# Patient Record
Sex: Female | Born: 1975
Health system: Southern US, Community
[De-identification: ages and names within clinical notes are randomized; demographics above are authoritative.]

## PROBLEM LIST (undated history)

## (undated) DIAGNOSIS — F329 Major depressive disorder, single episode, unspecified: Secondary | ICD-10-CM

## (undated) DIAGNOSIS — N200 Calculus of kidney: Secondary | ICD-10-CM

## (undated) DIAGNOSIS — F419 Anxiety disorder, unspecified: Secondary | ICD-10-CM

## (undated) DIAGNOSIS — E785 Hyperlipidemia, unspecified: Secondary | ICD-10-CM

## (undated) DIAGNOSIS — I10 Essential (primary) hypertension: Secondary | ICD-10-CM

## (undated) DIAGNOSIS — F32A Depression, unspecified: Secondary | ICD-10-CM

## (undated) HISTORY — DX: Major depressive disorder, single episode, unspecified: F32.9

## (undated) HISTORY — DX: Depression, unspecified: F32.A

## (undated) HISTORY — DX: Essential (primary) hypertension: I10

## (undated) HISTORY — DX: Hyperlipidemia, unspecified: E78.5

## (undated) HISTORY — DX: Anxiety disorder, unspecified: F41.9

## (undated) HISTORY — PX: WISDOM TOOTH EXTRACTION: SHX21

## (undated) HISTORY — DX: Calculus of kidney: N20.0

---

## 2002-07-31 ENCOUNTER — Encounter: Payer: Self-pay | Admitting: Gastroenterology

## 2002-07-31 ENCOUNTER — Ambulatory Visit (HOSPITAL_COMMUNITY): Admission: RE | Admit: 2002-07-31 | Discharge: 2002-07-31 | Payer: Self-pay | Admitting: Gastroenterology

## 2002-08-01 ENCOUNTER — Ambulatory Visit (HOSPITAL_COMMUNITY): Admission: RE | Admit: 2002-08-01 | Discharge: 2002-08-01 | Payer: Self-pay | Admitting: Gastroenterology

## 2002-08-01 ENCOUNTER — Encounter: Payer: Self-pay | Admitting: Gastroenterology

## 2005-01-02 HISTORY — PX: LITHOTRIPSY: SUR834

## 2005-05-30 ENCOUNTER — Ambulatory Visit: Payer: Self-pay | Admitting: Internal Medicine

## 2005-06-05 ENCOUNTER — Ambulatory Visit (HOSPITAL_COMMUNITY): Admission: RE | Admit: 2005-06-05 | Discharge: 2005-06-05 | Payer: Self-pay | Admitting: Urology

## 2005-06-13 ENCOUNTER — Ambulatory Visit (HOSPITAL_COMMUNITY): Admission: RE | Admit: 2005-06-13 | Discharge: 2005-06-13 | Payer: Self-pay | Admitting: Urology

## 2005-08-21 ENCOUNTER — Emergency Department: Payer: Self-pay | Admitting: Internal Medicine

## 2006-12-18 ENCOUNTER — Ambulatory Visit: Payer: Self-pay

## 2007-01-17 IMAGING — CR DG ABDOMEN 1V
1 series · 1 of 1 positions shown · non-contrast
Comparison: Previous study of 06/05/05.

CLINICAL DATA: Postop stone, left renal collecting system. 
 ABDOMEN ? 1 VIEW:

[t abdomen supine]
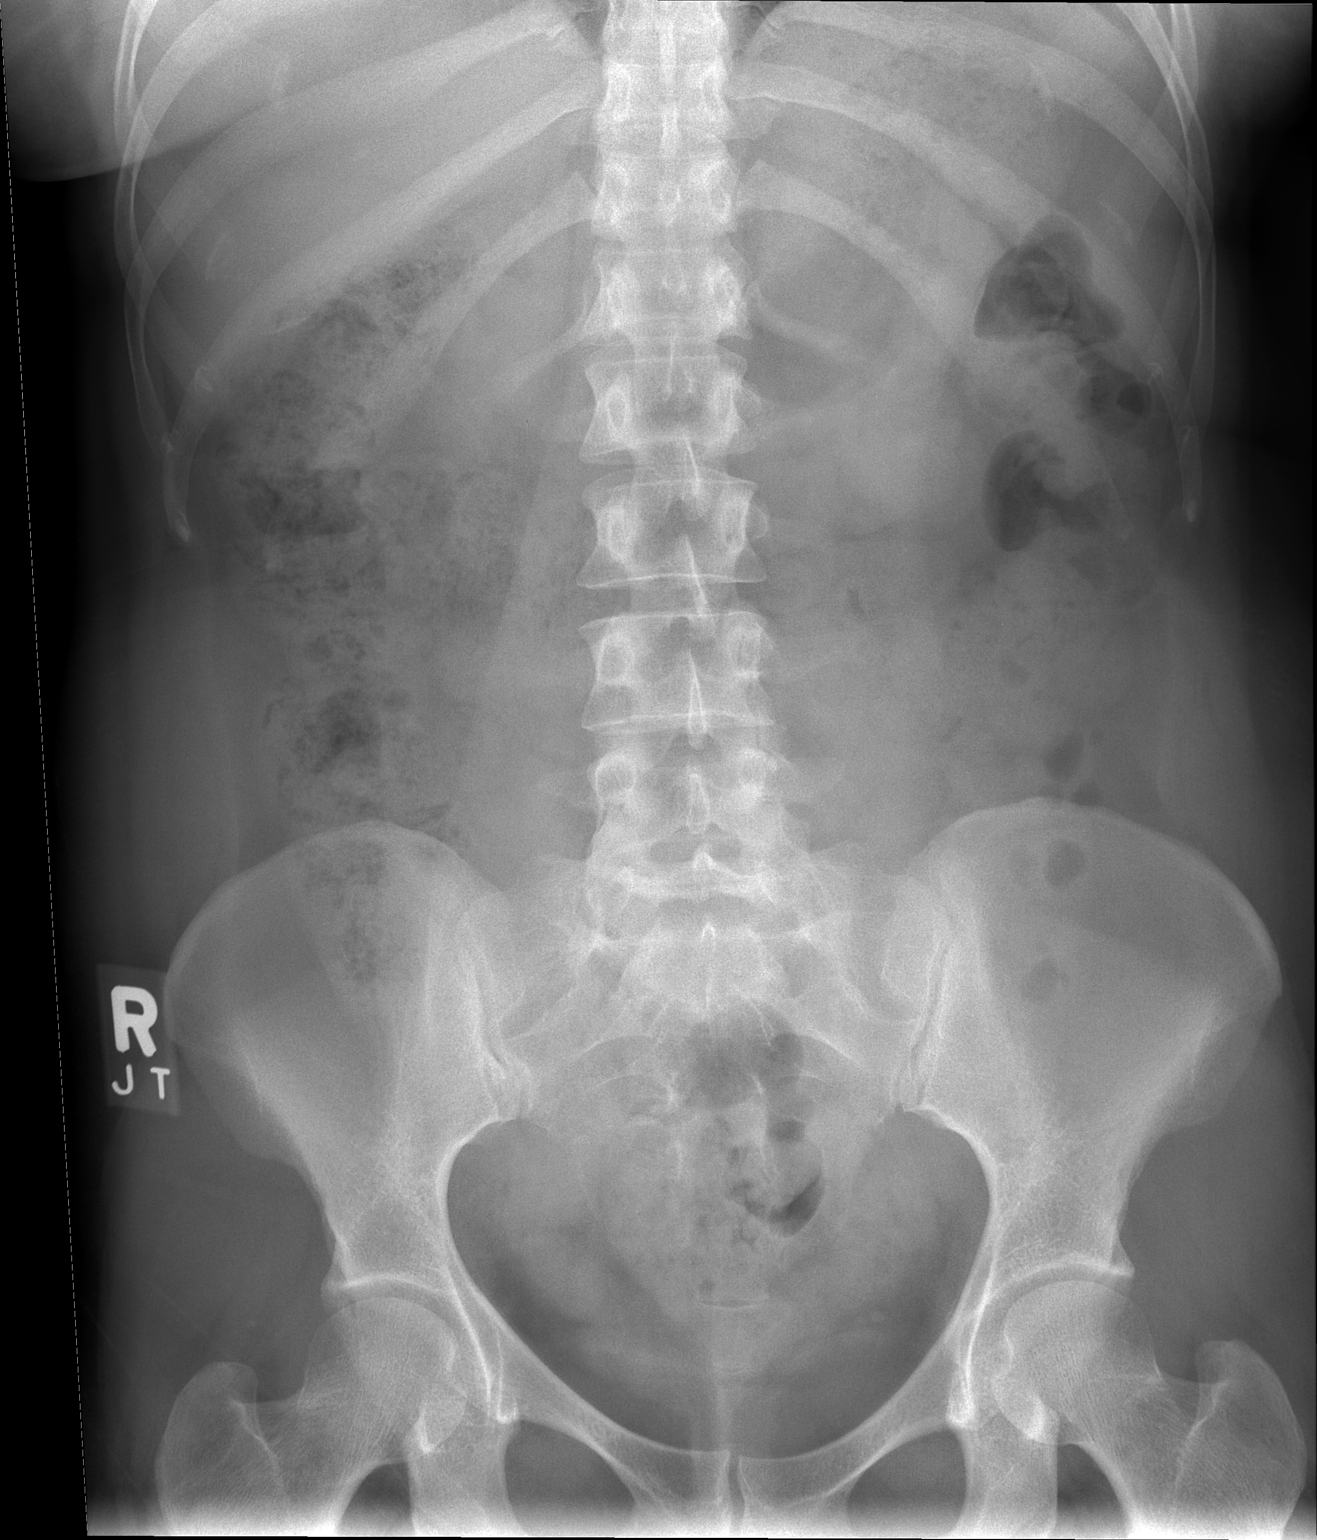

[1 of 1 positions shown; findings below may reference images not displayed]

FINDINGS: Single KUB is made postop.  The left 4 x 14 mm stone that was seen in the region of the left ureteropelvic junction is no longer.  There are seen two small densities in the region of the left distal ureter just above the ureterovesical junction, one of which measures 2 mm, and the other measures 4 mm.
IMPRESSION: The large 4 x 14 mm calcification in the region of the left ureteropelvic junction now appears to have resolved, with two smaller calcifications, which appear to be in the region of the left distal ureter and measure 2 and 4 mm in maximum diameters.

## 2009-11-02 ENCOUNTER — Emergency Department: Payer: Self-pay | Admitting: Emergency Medicine

## 2013-03-27 ENCOUNTER — Telehealth: Payer: Self-pay | Admitting: Internal Medicine

## 2013-03-27 NOTE — Telephone Encounter (Signed)
The patient is calling to establish with you . She stated she was a patient of yours at [facility]. Please advise

## 2013-03-27 NOTE — Telephone Encounter (Signed)
Ok

## 2013-04-14 NOTE — Telephone Encounter (Signed)
Appointment made by donna 6/11

## 2013-06-12 ENCOUNTER — Ambulatory Visit: Payer: Self-pay | Admitting: Internal Medicine

## 2013-08-26 ENCOUNTER — Ambulatory Visit: Payer: Self-pay | Admitting: Internal Medicine

## 2014-01-06 ENCOUNTER — Ambulatory Visit: Payer: Self-pay | Admitting: Internal Medicine

## 2014-03-17 ENCOUNTER — Ambulatory Visit: Payer: Self-pay | Admitting: Internal Medicine

## 2014-07-02 ENCOUNTER — Ambulatory Visit: Payer: Self-pay | Admitting: Internal Medicine

## 2014-09-17 ENCOUNTER — Ambulatory Visit: Payer: Self-pay | Admitting: Internal Medicine

## 2014-09-28 ENCOUNTER — Encounter: Payer: Self-pay | Admitting: *Deleted

## 2014-09-28 ENCOUNTER — Ambulatory Visit
Admission: RE | Admit: 2014-09-28 | Discharge: 2014-09-28 | Disposition: A | Discharge status: Home / Self Care | Payer: Self-pay | Source: Ambulatory Visit | Attending: Oncology | Admitting: Oncology

## 2014-09-28 ENCOUNTER — Ambulatory Visit: Payer: Self-pay | Attending: Oncology | Admitting: *Deleted

## 2014-09-28 VITALS — BP 138/88 | HR 63 | Temp 96.9°F | Ht 59.0 in | Wt 148.0 lb

## 2014-09-28 DIAGNOSIS — Z0000 Encounter for general adult medical examination without abnormal findings: Secondary | ICD-10-CM

## 2014-09-28 NOTE — Progress Notes (Signed)
Letter mailed from the [facility] to inform patient of her normal mammogram results.  Patient is to follow-up with annual screening in one year.  HSIS to Christy. 

## 2014-09-28 NOTE — Progress Notes (Signed)
Subjective:     Patient ID: Angelica May, female   DOB: May 30, 1975, 39 y.o.   MRN: [id number]  HPI   Review of Systems     Objective:   Physical Exam  Pulmonary/Chest: Right breast exhibits no inverted nipple, no mass, no nipple discharge, no skin change and no tenderness. Left breast exhibits no inverted nipple, no mass, no nipple discharge, no skin change and no tenderness. Breasts are symmetrical.       Assessment:     39 year old White female presents to [facility] for clinical breast exam and mammogram.  Clinical breast exam unremarkable.  Taught self breast awareness.  Family history of breast cancer includes a maternal grandmother and maternal first cousin with breast cancer in her 37's.  Patient has been screened for eligibility.  She does not have any insurance, Medicare or Medicaid.  She also meets financial eligibility.  Hand-out given on the Affordable Care Act.     Plan:     Screening mammogram ordered.  Will follow up per protocol.

## 2014-09-30 ENCOUNTER — Ambulatory Visit: Payer: Self-pay

## 2015-01-12 ENCOUNTER — Ambulatory Visit: Payer: Self-pay | Admitting: Internal Medicine

## 2015-03-11 ENCOUNTER — Ambulatory Visit: Payer: Self-pay | Admitting: Internal Medicine

## 2015-08-10 ENCOUNTER — Ambulatory Visit: Payer: Self-pay | Admitting: Internal Medicine

## 2015-09-14 ENCOUNTER — Ambulatory Visit: Payer: Self-pay | Admitting: Internal Medicine

## 2015-11-15 ENCOUNTER — Ambulatory Visit: Payer: Self-pay | Attending: Oncology

## 2015-11-15 ENCOUNTER — Ambulatory Visit
Admission: RE | Admit: 2015-11-15 | Discharge: 2015-11-15 | Disposition: A | Discharge status: Home / Self Care | Payer: Self-pay | Source: Ambulatory Visit | Attending: Oncology | Admitting: Oncology

## 2015-11-15 VITALS — BP 154/95 | HR 65 | Temp 98.1°F | Ht 58.27 in | Wt 154.3 lb

## 2015-11-15 DIAGNOSIS — Z0000 Encounter for general adult medical examination without abnormal findings: Secondary | ICD-10-CM

## 2015-11-15 NOTE — Progress Notes (Signed)
Subjective:     Patient ID: Angelica May, female   DOB: 28-Mar-1975, 40 y.o.   MRN: [id number]  HPI   Review of Systems     Objective:   Physical Exam  Pulmonary/Chest: Right breast exhibits no inverted nipple, no mass, no nipple discharge, no skin change and no tenderness. Left breast exhibits no inverted nipple, no mass, no nipple discharge, no skin change and no tenderness. Breasts are symmetrical.       Assessment:     40 [facility]  Patient screened, and meets [facility] eligibility.  Patient does not have insurance, Medicare or Medicaid.  Handout given on Affordable Care Act.  Instructed patient on breast self-exam using teach back method.  CBE unremarkable.  No mass or lump palpated.  Patient states she took blood pressure medication this morning.  Feels it is elevated due to nervousness.      Plan:     Sent for bilateral screening mammogram.

## 2015-12-29 NOTE — Progress Notes (Signed)
Letter mailed from [facility] to notify of normal mammogram results.  Patient to return in one year for annual screening.  Copy to HSIS. 

## 2016-01-08 ENCOUNTER — Encounter: Payer: Self-pay | Admitting: Emergency Medicine

## 2016-01-08 ENCOUNTER — Emergency Department
Admission: EM | Admit: 2016-01-08 | Discharge: 2016-01-08 | Disposition: A | Discharge status: Home / Self Care | Payer: BLUE CROSS/BLUE SHIELD | Attending: Emergency Medicine | Admitting: Emergency Medicine

## 2016-01-08 DIAGNOSIS — K0889 Other specified disorders of teeth and supporting structures: Secondary | ICD-10-CM | POA: Diagnosis present

## 2016-01-08 DIAGNOSIS — K029 Dental caries, unspecified: Secondary | ICD-10-CM

## 2016-01-08 MED ORDER — CLINDAMYCIN HCL 150 MG PO CAPS: 450.0000 mg | ORAL_CAPSULE | Freq: Three times a day (TID) | ORAL | 0 refills | Status: AC

## 2016-01-08 MED ORDER — HYDROCODONE-ACETAMINOPHEN 5-325 MG PO TABS: 1.0000 | ORAL_TABLET | ORAL | 0 refills | Status: DC | PRN

## 2016-01-08 NOTE — ED Triage Notes (Signed)
Patient with left lower dental pain times three days. Patient states that she has taken Advil and tylenol with no relief.

## 2016-01-08 NOTE — ED Provider Notes (Signed)
[facility] Emergency Department Provider Note  ____________________________________________   First MD Initiated Contact with Patient 01/08/16 0510     (approximate)  I have reviewed the triage vital signs and the nursing notes.   HISTORY  Chief Complaint Dental Pain    HPI Angelica May is a 41 y.o. female who presents for evaluation of about 3 days of left lower dental pain.  She states that she has had a problem with that tooth for an extended period of time but it just started hurting her acutely over the last several days.  Cold and hot foods and drinks make it worse and nothing makes it better.  It is a constant aching pain that is keeping her up at night.  She has been trying ibuprofen but it does not provide any relief.  She has an appointment with her dentist but the appointment is not for another 4 days.  She has had some swelling at the site of the tooth that is problematic but the swelling is not spreading.  She denies fever/chills, chest pain, shortness of breath, nausea, vomiting, difficulty swallowing, changes to her voice, neck pain/throat pain.   History reviewed. No pertinent past medical history.  There are no active problems to display for this patient.   Past Surgical History:  Procedure Laterality Date  . WISDOM TOOTH EXTRACTION      Prior to Admission medications   Medication Sig Start Date End Date Taking? Authorizing Provider  clindamycin (CLEOCIN) 150 MG capsule Take 3 capsules (450 mg total) by mouth 3 (three) times daily. 01/08/16 01/18/16  Loleta Rose, MD  HYDROcodone-acetaminophen (NORCO/VICODIN) 5-325 MG tablet Take 1-2 tablets by mouth every 4 (four) hours as needed for moderate pain. 01/08/16   Loleta Rose, MD    Allergies Penicillins  Family History  Problem Relation Age of Onset  . Breast cancer Maternal Grandmother   . Breast cancer Cousin     Social History Social History  Substance Use Topics  . Smoking  status: Never Smoker  . Smokeless tobacco: Never Used  . Alcohol use Yes     Comment: occ    Review of Systems Constitutional: No fever/chills Eyes: No visual changes. ENT: Dental pain in the middle on the left lower jaw.  No sore throat. Cardiovascular: Denies chest pain. Respiratory: Denies shortness of breath. Gastrointestinal: No abdominal pain.  No nausea, no vomiting.  No diarrhea.  No constipation. Genitourinary: Negative for dysuria. Musculoskeletal: Negative for back pain. Skin: Negative for rash. Neurological: Negative for headaches, focal weakness or numbness.  10-point ROS otherwise negative.  ____________________________________________   PHYSICAL EXAM:  VITAL SIGNS: ED Triage Vitals  Enc Vitals Group     BP 01/08/16 0054 (!) 166/107     Pulse Rate 01/08/16 0054 87     Resp 01/08/16 0054 18     Temp 01/08/16 0054 98.4 F (36.9 C)     Temp Source 01/08/16 0054 Oral     SpO2 01/08/16 0054 97 %     Weight 01/08/16 0051 145 lb (65.8 kg)     Height 01/08/16 0051 4\' 11"  (1.499 m)     Head Circumference --      Peak Flow --      Pain Score 01/08/16 0449 5     Pain Loc --      Pain Edu? --      Excl. in GC? --     Constitutional: Alert and oriented. Well appearing and in no  acute distress. Eyes: Conjunctivae are normal. PERRL. EOMI. Head: Atraumatic. Nose: No congestion/rhinnorhea. Mouth/Throat: Mucous membranes are moist.  Oropharynx non-erythematous. Multiple chronic caries.  The tooth in question is #20 with obvious caries, no sign of acute infection/abscess.  No evidence of Ludwig's.  Neck: No stridor.  No meningeal signs.   Cardiovascular: Normal rate, regular rhythm. Good peripheral circulation.  Respiratory: Normal respiratory effort.  No retractions.  Psychiatric: Mood and affect are normal. Speech and behavior are normal.  ____________________________________________   LABS (all labs ordered are listed, but only abnormal results are  displayed)  Labs Reviewed - No data to display ____________________________________________  EKG  None - EKG not ordered by ED physician ____________________________________________  RADIOLOGY   No results found.  ____________________________________________   PROCEDURES  Procedure(s) performed:   Procedures   Critical Care performed: No ____________________________________________   INITIAL IMPRESSION / ASSESSMENT AND PLAN / ED COURSE  Pertinent labs & imaging results that were available during my care of the patient were reviewed by me and considered in my medical decision making (see chart for details).  I reviewed the patient's prescription history over the last 12 months in the [place] Controlled Substances Database, and she has filled no prescription for opioids within that time.  I we will prescribe her a short course of Norco to help with the acute pain in addition to Penicillin VK for the possibility of a localized dental infection.  She will follow-up in 4 days with her dentist as planned.  I gave my usual and customary return precautions.     ____________________________________________  FINAL CLINICAL IMPRESSION(S) / ED DIAGNOSES  Final diagnoses:  Pain, dental  Dental caries     MEDICATIONS GIVEN DURING THIS VISIT:  Medications - No data to display   NEW OUTPATIENT MEDICATIONS STARTED DURING THIS VISIT:  Discharge Medication List as of 01/08/2016  5:30 AM    START taking these medications   Details  clindamycin (CLEOCIN) 150 MG capsule Take 3 capsules (450 mg total) by mouth 3 (three) times daily., Starting Sat 01/08/2016, Until Tue 01/18/2016, Print    HYDROcodone-acetaminophen (NORCO/VICODIN) 5-325 MG tablet Take 1-2 tablets by mouth every 4 (four) hours as needed for moderate pain., Starting Sat 01/08/2016, Print        Discharge Medication List as of 01/08/2016  5:30 AM      Discharge Medication List as of 01/08/2016  5:30 AM       Note:   This document was prepared using Dragon voice recognition software and may include unintentional dictation errors.    Loleta Roseory Jevonte Clanton, MD 01/08/16 [phone]

## 2016-01-08 NOTE — Discharge Instructions (Signed)

## 2016-01-27 ENCOUNTER — Ambulatory Visit: Payer: Self-pay | Admitting: Internal Medicine

## 2016-04-27 ENCOUNTER — Ambulatory Visit: Payer: Self-pay | Admitting: Internal Medicine

## 2016-06-11 ENCOUNTER — Encounter: Payer: Self-pay | Admitting: *Deleted

## 2016-06-11 ENCOUNTER — Emergency Department: Payer: BLUE CROSS/BLUE SHIELD

## 2016-06-11 ENCOUNTER — Emergency Department
Admission: EM | Admit: 2016-06-11 | Discharge: 2016-06-11 | Disposition: A | Discharge status: Home / Self Care | Payer: BLUE CROSS/BLUE SHIELD | Attending: Emergency Medicine | Admitting: Emergency Medicine

## 2016-06-11 DIAGNOSIS — N200 Calculus of kidney: Secondary | ICD-10-CM | POA: Diagnosis not present

## 2016-06-11 DIAGNOSIS — Z79899 Other long term (current) drug therapy: Secondary | ICD-10-CM | POA: Diagnosis not present

## 2016-06-11 DIAGNOSIS — R1031 Right lower quadrant pain: Secondary | ICD-10-CM | POA: Diagnosis present

## 2016-06-11 LAB — CBC
HCT: 41.8 % (ref 35.0–47.0)
Hemoglobin: 14.2 g/dL (ref 12.0–16.0)
MCH: 30.4 pg (ref 26.0–34.0)
MCHC: 34.1 g/dL (ref 32.0–36.0)
MCV: 89.1 fL (ref 80.0–100.0)
PLATELETS: 371 10*3/uL (ref 150–440)
RBC: 4.69 MIL/uL (ref 3.80–5.20)
RDW: 13.4 % (ref 11.5–14.5)
WBC: 16.2 10*3/uL — AB (ref 3.6–11.0)

## 2016-06-11 LAB — COMPREHENSIVE METABOLIC PANEL
ALT: 19 U/L (ref 14–54)
AST: 23 U/L (ref 15–41)
Albumin: 4.6 g/dL (ref 3.5–5.0)
Alkaline Phosphatase: 70 U/L (ref 38–126)
Anion gap: 9 (ref 5–15)
BILIRUBIN TOTAL: 1.2 mg/dL (ref 0.3–1.2)
BUN: 10 mg/dL (ref 6–20)
CHLORIDE: 103 mmol/L (ref 101–111)
CO2: 25 mmol/L (ref 22–32)
CREATININE: 0.99 mg/dL (ref 0.44–1.00)
Calcium: 9.3 mg/dL (ref 8.9–10.3)
Glucose, Bld: 152 mg/dL — ABNORMAL HIGH (ref 65–99)
POTASSIUM: 4.3 mmol/L (ref 3.5–5.1)
Sodium: 137 mmol/L (ref 135–145)
TOTAL PROTEIN: 8.4 g/dL — AB (ref 6.5–8.1)

## 2016-06-11 LAB — URINALYSIS, COMPLETE (UACMP) WITH MICROSCOPIC
BILIRUBIN URINE: NEGATIVE
Bacteria, UA: NONE SEEN
GLUCOSE, UA: 50 mg/dL — AB
HGB URINE DIPSTICK: NEGATIVE
Ketones, ur: NEGATIVE mg/dL
NITRITE: NEGATIVE
PH: 7 (ref 5.0–8.0)
Protein, ur: NEGATIVE mg/dL
SPECIFIC GRAVITY, URINE: 1.012 (ref 1.005–1.030)

## 2016-06-11 LAB — POCT PREGNANCY, URINE: PREG TEST UR: NEGATIVE

## 2016-06-11 MED ORDER — OXYCODONE-ACETAMINOPHEN 5-325 MG PO TABS
ORAL_TABLET | ORAL | Status: AC
  Administered 2016-06-11: 1
  Filled 2016-06-11: qty 1

## 2016-06-11 MED ORDER — ONDANSETRON 4 MG PO TBDP
4.0000 mg | ORAL_TABLET | Freq: Once | ORAL | Status: AC | PRN
  Administered 2016-06-11: 4 mg via ORAL
  Filled 2016-06-11: qty 1

## 2016-06-11 MED ORDER — SULFAMETHOXAZOLE-TRIMETHOPRIM 800-160 MG PO TABS: 1.0000 | ORAL_TABLET | Freq: Two times a day (BID) | ORAL | 0 refills | Status: DC

## 2016-06-11 MED ORDER — ONDANSETRON HCL 4 MG PO TABS: 4.0000 mg | ORAL_TABLET | Freq: Three times a day (TID) | ORAL | 0 refills | Status: DC | PRN

## 2016-06-11 MED ORDER — DEXTROSE 5 % IV SOLN
1.0000 g | Freq: Once | INTRAVENOUS | Status: AC
  Administered 2016-06-11: 1 g via INTRAVENOUS
  Filled 2016-06-11: qty 10

## 2016-06-11 MED ORDER — OXYCODONE-ACETAMINOPHEN 5-325 MG PO TABS: 1.0000 | ORAL_TABLET | ORAL | 0 refills | Status: DC | PRN

## 2016-06-11 NOTE — ED Provider Notes (Signed)
[facility] Emergency Department Provider Note   ____________________________________________   I have reviewed the triage vital signs and the nursing notes.   HISTORY  Chief Complaint Flank Pain and Emesis   History limited by: Not Limited   HPI Angelica May is a 41 y.o. female who presents to the emergency department today because of concerns for right flank pain. The patient states that the pain started today. It is located in the right flank and right back. It was accompanied by nausea and vomiting. It did remind the patient of the time she had a kidney stones. She has required lithotripsy in the past. She denies any fevers. She has not noticed any blood in her urine.    History reviewed. No pertinent past medical history.  There are no active problems to display for this patient.   Past Surgical History:  Procedure Laterality Date  . WISDOM TOOTH EXTRACTION      Prior to Admission medications   Medication Sig Start Date End Date Taking? Authorizing Provider  HYDROcodone-acetaminophen (NORCO/VICODIN) 5-325 MG tablet Take 1-2 tablets by mouth every 4 (four) hours as needed for moderate pain. 01/08/16   Angelica RoseForbach, Cory, MD    Allergies Penicillins  Family History  Problem Relation Age of Onset  . Breast cancer Maternal Grandmother   . Breast cancer Cousin     Social History Social History  Substance Use Topics  . Smoking status: Never Smoker  . Smokeless tobacco: Never Used  . Alcohol use Yes     Comment: occ    Review of Systems Constitutional: No fever/chills Eyes: No visual changes. ENT: No sore throat. Cardiovascular: Denies chest pain. Respiratory: Denies shortness of breath. Gastrointestinal: Positive for nausea and vomiting.  Genitourinary: Negative for dysuria. Musculoskeletal: Positive for right sided flank pain. Skin: Negative for rash. Neurological: Negative for headaches, focal weakness or  numbness.  ____________________________________________   PHYSICAL EXAM:  VITAL SIGNS: ED Triage Vitals [06/11/16 1502]  Enc Vitals Group     BP (!) 149/91     Pulse Rate 62     Resp 18     Temp 97.6 F (36.4 C)     Temp Source Oral     SpO2 99 %     Weight 145 lb (65.8 kg)     Height 4\' 11"  (1.499 m)     Head Circumference      Peak Flow      Pain Score 10   Constitutional: Alert and oriented. Well appearing and in no distress. Eyes: Conjunctivae are normal.  ENT   Head: Normocephalic and atraumatic.   Nose: No congestion/rhinnorhea.   Mouth/Throat: Mucous membranes are moist.   Neck: No stridor. Hematological/Lymphatic/Immunilogical: No cervical lymphadenopathy. Cardiovascular: Normal rate, regular rhythm.  No murmurs, rubs, or gallops.  Respiratory: Normal respiratory effort without tachypnea nor retractions. Breath sounds are clear and equal bilaterally. No wheezes/rales/rhonchi. Gastrointestinal: Soft and non tender. No rebound. No guarding.  Genitourinary: Deferred Musculoskeletal: Normal range of motion in all extremities. No lower extremity edema. Neurologic:  Normal speech and language. No gross focal neurologic deficits are appreciated.  Skin:  Skin is warm, dry and intact. No rash noted. Psychiatric: Mood and affect are normal. Speech and behavior are normal. Patient exhibits appropriate insight and judgment.  ____________________________________________    LABS (pertinent positives/negatives)  Labs Reviewed  COMPREHENSIVE METABOLIC PANEL - Abnormal; Notable for the following:       Result Value   Glucose, Bld 152 (*)  Total Protein 8.4 (*)    All other components within normal limits  CBC - Abnormal; Notable for the following:    WBC 16.2 (*)    All other components within normal limits  URINALYSIS, COMPLETE (UACMP) WITH MICROSCOPIC - Abnormal; Notable for the following:    Color, Urine YELLOW (*)    APPearance CLOUDY (*)    Glucose,  UA 50 (*)    Leukocytes, UA LARGE (*)    Squamous Epithelial / LPF 0-5 (*)    All other components within normal limits     ____________________________________________   EKG  None  ____________________________________________    RADIOLOGY  CT renal IMPRESSION: 1. Obstructing of right renal collecting system stone measures 11.5 x 11.5 x 15.0 mm. 2. Moderate right-sided hydronephrosis. 3. Additional punctate nonobstructing stone at the lower pole of the right kidney. 4. No significant left-sided nephrolithiasis.  ____________________________________________   PROCEDURES  Procedures  ____________________________________________   INITIAL IMPRESSION / ASSESSMENT AND PLAN / ED COURSE  Pertinent labs & imaging results that were available during my care of the patient were reviewed by me and considered in my medical decision making (see chart for details).  Patient presented to the emergency department today with right flank pain. CT shows a obstructing the right renal collecting system stone which measures 11.5 x 11.5 x 15 mm. Patient's urine did have some white blood cells patient had mild leukocytosis. Patient was given dose of IV antibiotics here. Discussed with Dr. Marlou Porch with urology. At this point given the patient's pain and nausea is well controlled. Will have patient follow-up as outpatient. Will do patient prescriptions for narcotics, antiemetics and Bactrim. Return precautions were discussed with the patient.   [place] drug database was checked prior to prescription.   ____________________________________________   FINAL CLINICAL IMPRESSION(S) / ED DIAGNOSES  Final diagnoses:  Kidney stone     Note: This dictation was prepared with Dragon dictation. Any transcriptional errors that result from this process are unintentional     Phineas Semen, MD 06/11/16 1918

## 2016-06-11 NOTE — Discharge Instructions (Signed)
Please seek medical attention for any high fevers, chest pain, shortness of breath, change in behavior, persistent vomiting, bloody stool or any other new or concerning symptoms.  

## 2016-06-11 NOTE — ED Triage Notes (Signed)
Pt states right flank pain, nausea and vomiting that began today, denies any blood in her urine, awake and alert in no acute distress

## 2016-06-12 ENCOUNTER — Telehealth: Payer: Self-pay | Admitting: Urology

## 2016-06-12 NOTE — Telephone Encounter (Signed)
-----   Message from Crist FatBenjamin W Herrick, MD sent at 06/12/2016  4:29 AM EDT ----- Regarding: FW: Right obstructing stone This patient has a large stone at the kidney and was seen in the ED yesterday.  She needs an urgent appointment to be scheduled for surgery.  Thanks,  ben   ----- Message ----- From: Phineas SemenGoodman, Graydon, MD Sent: 06/11/2016   7:02 PM To: Crist FatBenjamin W Herrick, MD Subject: Right obstructing stone                        Thanks for your help. Phineas SemenGraydon Goodman

## 2016-06-12 NOTE — Telephone Encounter (Signed)
App made left message for patient to cb  michelle

## 2016-06-13 ENCOUNTER — Ambulatory Visit (INDEPENDENT_AMBULATORY_CARE_PROVIDER_SITE_OTHER): Payer: BLUE CROSS/BLUE SHIELD | Admitting: Urology

## 2016-06-13 ENCOUNTER — Other Ambulatory Visit: Payer: Self-pay | Admitting: Radiology

## 2016-06-13 ENCOUNTER — Ambulatory Visit
Admission: RE | Admit: 2016-06-13 | Discharge: 2016-06-13 | Disposition: A | Discharge status: Home / Self Care | Payer: BLUE CROSS/BLUE SHIELD | Source: Ambulatory Visit | Attending: Urology | Admitting: Urology

## 2016-06-13 ENCOUNTER — Encounter: Payer: Self-pay | Admitting: Urology

## 2016-06-13 VITALS — BP 143/90 | HR 81 | Ht 59.0 in | Wt 140.0 lb

## 2016-06-13 DIAGNOSIS — N200 Calculus of kidney: Secondary | ICD-10-CM | POA: Diagnosis not present

## 2016-06-13 LAB — URINALYSIS, COMPLETE
BILIRUBIN UA: NEGATIVE
GLUCOSE, UA: NEGATIVE
Ketones, UA: NEGATIVE
Nitrite, UA: NEGATIVE
Specific Gravity, UA: >1.03 — ABNORMAL HIGH (ref 1.005–1.030)
UUROB: 1 mg/dL (ref 0.2–1.0)
pH, UA: 5.5 (ref 5.0–7.5)

## 2016-06-13 LAB — MICROSCOPIC EXAMINATION: Epithelial Cells (non renal): >10 /hpf — ABNORMAL HIGH (ref 0–10)

## 2016-06-13 LAB — URINE CULTURE

## 2016-06-13 NOTE — Progress Notes (Signed)
06/13/2016 11:19 AM   Angelica May 1975/01/29 [id number]  Referring provider: Dale Byron, MD [address] [place], [place] [address]  Chief Complaint  Patient presents with  . Nephrolithiasis    New Patient    HPI: Consultation for right UPJ stone. Patient presented 2 days ago with the acute onset of right flank pain. A CT scan of the abdomen and pelvis was obtained and I thought the stone measured was a little bit overdrawn. I measured the right UPJ stone a 12 x 14 mm (? Visible on scout, HU 1250, ssd 8.5 cm). There were no other stones. Her BUN was 10 and creatinine 0.99. White blood cell count was 16 with no microscopic hematuria and white blood cells of 6-30 on the UA. Urine cx grew multiple species.   She hasn't seen a stone pass. Staying well hydrated. Pain tolerable.   She has a history of stones. She saw Dr. Brunilda Payor. She had ESWL of a left distal stone in 2007.  She has no microscopic hematuria. Her UA today shows a few bacteria, 3-10 red blood cells, greater than 30 white cells. It will be sent for culture.  She's had no dysuria. No fever or chills.    PMH: Past Medical History:  Diagnosis Date  . Anxiety   . Depression   . Hyperlipemia   . Hypertension   . Nephrolithiasis     Surgical History: Past Surgical History:  Procedure Laterality Date  . LITHOTRIPSY  2007  . WISDOM TOOTH EXTRACTION      Home Medications:  Allergies as of 06/13/2016      Reactions   Penicillins Rash   Has patient had a PCN reaction causing immediate rash, facial/tongue/throat swelling, SOB or lightheadedness with hypotension: Yes Has patient had a PCN reaction causing severe rash involving mucus membranes or skin necrosis: No Has patient had a PCN reaction that required hospitalization: No Has patient had a PCN reaction occurring within the last 10 years: No If all of the above answers are "NO", then may proceed with Cephalosporin use.      Medication  List       Accurate as of 06/13/16 11:19 AM. Always use your most recent med list.          amphetamine-dextroamphetamine 30 MG tablet Commonly known as:  ADDERALL TK 1 T PO EVERY MORNING AND EVERY DAY AT NOON   desvenlafaxine 100 MG 24 hr tablet Commonly known as:  PRISTIQ Take 100 mg by mouth daily.   nebivolol 5 MG tablet Commonly known as:  BYSTOLIC Take 5 mg by mouth daily.   norethindrone 0.35 MG tablet Commonly known as:  MICRONOR,CAMILA,ERRIN Take 1 tablet by mouth daily.   ondansetron 4 MG tablet Commonly known as:  ZOFRAN Take 1 tablet (4 mg total) by mouth every 8 (eight) hours as needed for nausea or vomiting.   oxyCODONE-acetaminophen 5-325 MG tablet Commonly known as:  ROXICET Take 1 tablet by mouth every 4 (four) hours as needed for severe pain.   sulfamethoxazole-trimethoprim 800-160 MG tablet Commonly known as:  BACTRIM DS,SEPTRA DS Take 1 tablet by mouth 2 (two) times daily.   traZODone 50 MG tablet Commonly known as:  DESYREL Take 50 mg by mouth at bedtime.       Allergies:  Allergies  Allergen Reactions  . Penicillins Rash    Has patient had a PCN reaction causing immediate rash, facial/tongue/throat swelling, SOB or lightheadedness with hypotension: Yes Has patient had a  PCN reaction causing severe rash involving mucus membranes or skin necrosis: No Has patient had a PCN reaction that required hospitalization: No Has patient had a PCN reaction occurring within the last 10 years: No If all of the above answers are "NO", then may proceed with Cephalosporin use.     Family History: Family History  Problem Relation Age of Onset  . Breast cancer Maternal Grandmother   . Breast cancer Cousin   . Kidney cancer Neg Hx   . Prostate cancer Neg Hx     Social History:  reports that she has never smoked. She has never used smokeless tobacco. She reports that she drinks alcohol. She reports that she does not use drugs.  ROS: UROLOGY Frequent  Urination?: No Hard to postpone urination?: No Burning/pain with urination?: No Get up at night to urinate?: No Leakage of urine?: No Urine stream starts and stops?: No Trouble starting stream?: No Do you have to strain to urinate?: No Blood in urine?: No Urinary tract infection?: No Sexually transmitted disease?: No Injury to kidneys or bladder?: No Painful intercourse?: No Weak stream?: No Currently pregnant?: No Vaginal bleeding?: No Last menstrual period?: n  Gastrointestinal Nausea?: Yes Vomiting?: Yes Indigestion/heartburn?: No Diarrhea?: No Constipation?: No  Constitutional Fever: No Night sweats?: No Weight loss?: No Fatigue?: No  Skin Skin rash/lesions?: No Itching?: No  Eyes Blurred vision?: No Double vision?: No  Ears/Nose/Throat Sore throat?: No Sinus problems?: No  Hematologic/Lymphatic Swollen glands?: No Easy bruising?: No  Cardiovascular Leg swelling?: No Chest pain?: No  Respiratory Cough?: No Shortness of breath?: No  Endocrine Excessive thirst?: No  Musculoskeletal Back pain?: Yes Joint pain?: No  Neurological Headaches?: No Dizziness?: No  Psychologic Depression?: No Anxiety?: No  Physical Exam: BP (!) 143/90   Pulse 81   Ht 4\' 11"  (1.499 m)   Wt 63.5 kg (140 lb)   BMI 28.28 kg/m   Constitutional:  Alert and oriented, No acute distress. HEENT: [place] AT, moist mucus membranes.  Trachea midline, no masses. Cardiovascular: No clubbing, cyanosis, or edema. Respiratory: Normal respiratory effort, no increased work of breathing. GI: Abdomen is soft, nontender, nondistended, no abdominal masses GU: No CVA tenderness. Skin: No rashes, bruises or suspicious lesions. Lymph: No cervical or inguinal adenopathy. Neurologic: Grossly intact, no focal deficits, moving all 4 extremities. Psychiatric: Normal mood and affect.  Laboratory Data: Lab Results  Component Value Date   WBC 16.2 (H) 06/11/2016   HGB 14.2 06/11/2016    HCT 41.8 06/11/2016   MCV 89.1 06/11/2016   PLT 371 06/11/2016    Lab Results  Component Value Date   CREATININE 0.99 06/11/2016    No results found for: PSA  No results found for: TESTOSTERONE  No results found for: HGBA1C  Urinalysis    Component Value Date/Time   COLORURINE YELLOW (A) 06/11/2016 1504   APPEARANCEUR CLOUDY (A) 06/11/2016 1504   LABSPEC 1.012 06/11/2016 1504   PHURINE 7.0 06/11/2016 1504   GLUCOSEU 50 (A) 06/11/2016 1504   HGBUR NEGATIVE 06/11/2016 1504   BILIRUBINUR NEGATIVE 06/11/2016 1504   KETONESUR NEGATIVE 06/11/2016 1504   PROTEINUR NEGATIVE 06/11/2016 1504   NITRITE NEGATIVE 06/11/2016 1504   LEUKOCYTESUR LARGE (A) 06/11/2016 1504    Pertinent Imaging: CT scan   Assessment & Plan:    1. Nephrolithiasis - check KUB.  Discussed nature, risks, benefits of continued surveillance, ESWL and URS. Discussed relative success rates, complication rates and risks of staged procedure with each. Clinically she is not infected.  She had ESWL in the past and hopes to do that again.   - Urinalysis, Complete - CULTURE, URINE COMPREHENSIVE   No Follow-up on file.  Jerilee Field, MD  [facility] [address], [address] [place], [place] [address] [phone]

## 2016-06-14 ENCOUNTER — Telehealth: Payer: Self-pay | Admitting: Radiology

## 2016-06-14 ENCOUNTER — Telehealth: Payer: Self-pay

## 2016-06-14 MED ORDER — CIPROFLOXACIN HCL 500 MG PO TABS
500.0000 mg | ORAL_TABLET | ORAL | Status: AC
  Administered 2016-06-15: 500 mg via ORAL

## 2016-06-14 NOTE — Telephone Encounter (Signed)
-----   Message from Matthew Eskridge, MD sent at 06/13/2016  3:14 PM EDT ----- Notify patient stone is visible on KUB, so we'll plan for ESWL Thursday    ----- Message ----- From: Ares Tegtmeyer Michelle, CMA Sent: 06/13/2016   3:04 PM To: Matthew Eskridge, MD    ----- Message ----- From: Interface, Rad Results In Sent: 06/13/2016   2:55 PM To: [facility]    

## 2016-06-14 NOTE — Telephone Encounter (Signed)
-----   Message from Jerilee FieldMatthew Eskridge, MD sent at 06/13/2016  3:14 PM EDT ----- Notify patient stone is visible on KUB, so we'll plan for ESWL Thursday    ----- Message ----- From: Lissa HoardWatts, Sarah Michelle, CMA Sent: 06/13/2016   3:04 PM To: Jerilee FieldMatthew Eskridge, MD    ----- Message ----- From: Interface, Rad Results In Sent: 06/13/2016   2:55 PM To: Jennette [facility]

## 2016-06-14 NOTE — Telephone Encounter (Signed)
Patient notified/SW 

## 2016-06-14 NOTE — Telephone Encounter (Signed)
Notified pt that stone is visible on KUB & that we will plan to proceed with ESWL on 06/15/16. Pt voices understanding.

## 2016-06-15 ENCOUNTER — Ambulatory Visit
Admission: RE | Admit: 2016-06-15 | Discharge: 2016-06-15 | Disposition: A | Discharge status: Home / Self Care | Payer: BLUE CROSS/BLUE SHIELD | Source: Ambulatory Visit | Attending: Urology | Admitting: Urology

## 2016-06-15 ENCOUNTER — Encounter
Admission: RE | Disposition: A | Discharge status: Home / Self Care | Payer: Self-pay | Source: Ambulatory Visit | Attending: Urology

## 2016-06-15 ENCOUNTER — Encounter: Payer: Self-pay | Admitting: *Deleted

## 2016-06-15 DIAGNOSIS — F329 Major depressive disorder, single episode, unspecified: Secondary | ICD-10-CM | POA: Diagnosis not present

## 2016-06-15 DIAGNOSIS — Z880 Allergy status to penicillin: Secondary | ICD-10-CM | POA: Diagnosis not present

## 2016-06-15 DIAGNOSIS — E785 Hyperlipidemia, unspecified: Secondary | ICD-10-CM | POA: Insufficient documentation

## 2016-06-15 DIAGNOSIS — N200 Calculus of kidney: Secondary | ICD-10-CM

## 2016-06-15 DIAGNOSIS — I10 Essential (primary) hypertension: Secondary | ICD-10-CM | POA: Insufficient documentation

## 2016-06-15 DIAGNOSIS — F419 Anxiety disorder, unspecified: Secondary | ICD-10-CM | POA: Diagnosis not present

## 2016-06-15 DIAGNOSIS — Z79899 Other long term (current) drug therapy: Secondary | ICD-10-CM | POA: Diagnosis not present

## 2016-06-15 HISTORY — PX: LITHOTRIPSY: SUR834

## 2016-06-15 HISTORY — PX: EXTRACORPOREAL SHOCK WAVE LITHOTRIPSY: SHX1557

## 2016-06-15 LAB — POCT PREGNANCY, URINE: Preg Test, Ur: NEGATIVE

## 2016-06-15 SURGERY — LITHOTRIPSY, ESWL
Anesthesia: Moderate Sedation | Laterality: Right

## 2016-06-15 MED ORDER — TAMSULOSIN HCL 0.4 MG PO CAPS: 0.4000 mg | ORAL_CAPSULE | Freq: Every day | ORAL | 0 refills | Status: DC

## 2016-06-15 MED ORDER — DIAZEPAM 5 MG PO TABS
ORAL_TABLET | ORAL | Status: AC
  Filled 2016-06-15: qty 2

## 2016-06-15 MED ORDER — DIPHENHYDRAMINE HCL 25 MG PO CAPS
ORAL_CAPSULE | ORAL | Status: AC
  Filled 2016-06-15: qty 1

## 2016-06-15 MED ORDER — HYDROCODONE-ACETAMINOPHEN 5-325 MG PO TABS: 1.0000 | ORAL_TABLET | Freq: Four times a day (QID) | ORAL | 0 refills | Status: DC | PRN

## 2016-06-15 MED ORDER — DEXTROSE-NACL 5-0.45 % IV SOLN
INTRAVENOUS | Status: DC
  Administered 2016-06-15: 07:00:00 via INTRAVENOUS

## 2016-06-15 MED ORDER — DOCUSATE SODIUM 100 MG PO CAPS: 100.0000 mg | ORAL_CAPSULE | Freq: Two times a day (BID) | ORAL | 0 refills | Status: DC

## 2016-06-15 MED ORDER — CIPROFLOXACIN HCL 500 MG PO TABS
ORAL_TABLET | ORAL | Status: AC
  Filled 2016-06-15: qty 1

## 2016-06-15 MED ORDER — DIPHENHYDRAMINE HCL 25 MG PO CAPS
25.0000 mg | ORAL_CAPSULE | ORAL | Status: AC
  Administered 2016-06-15: 25 mg via ORAL

## 2016-06-15 MED ORDER — DIAZEPAM 5 MG PO TABS
10.0000 mg | ORAL_TABLET | ORAL | Status: AC
Start: 2016-06-15 — End: 2016-06-15
  Administered 2016-06-15: 10 mg via ORAL

## 2016-06-15 NOTE — Discharge Instructions (Addendum)
See [facility] discharge instructions in chart.  AMBULATORY SURGERY  DISCHARGE INSTRUCTIONS   1) The drugs that you were given will stay in your system until tomorrow so for the next 24 hours you should not:  A) Drive an automobile B) Make any legal decisions C) Drink any alcoholic beverage   2) You may resume regular meals tomorrow.  Today it is better to start with liquids and gradually work up to solid foods.  You may eat anything you prefer, but it is better to start with liquids, then soup and crackers, and gradually work up to solid foods.   3) Please notify your doctor immediately if you have any unusual bleeding, trouble breathing, redness and pain at the surgery site, drainage, fever, or pain not relieved by medication.    4) Additional Instructions:        Please contact your physician with any problems or Same Day Surgery at [phone], Monday through Friday 6 am to 4 pm, or [facility] Main number at [phone].  

## 2016-06-15 NOTE — Interval H&P Note (Signed)
History and Physical Interval Note:  06/15/2016 7:40 AM  Angelica GordonJulie H Brutus  has presented today for surgery, with the diagnosis of Kidney stone  The various methods of treatment have been discussed with the patient and family. After consideration of risks, benefits and other options for treatment, the patient has consented to  Procedure(s): EXTRACORPOREAL SHOCK WAVE LITHOTRIPSY (ESWL) (Right) as a surgical intervention .  The patient's history has been reviewed, patient examined, no change in status, stable for surgery.  I have reviewed the patient's chart and labs.  Questions were answered to the patient's satisfaction.    RRR CTAB  Vanna ScotlandAshley Anyiah Coverdale

## 2016-06-15 NOTE — H&P (View-Only) (Signed)
06/13/2016 11:19 AM   Angelica May 1975/01/29 [id number]  Referring provider: [place], MD [address] [place], [place] [address]  Chief Complaint  Patient presents with  . Nephrolithiasis    New Patient    HPI: Consultation for right UPJ stone. Patient presented 2 days ago with the acute onset of right flank pain. A CT scan of the abdomen and pelvis was obtained and I thought the stone measured was a little bit overdrawn. I measured the right UPJ stone a 12 x 14 mm (? Visible on scout, HU 1250, ssd 8.5 cm). There were no other stones. Her BUN was 10 and creatinine 0.99. White blood cell count was 16 with no microscopic hematuria and white blood cells of 6-30 on the UA. Urine cx grew multiple species.   She hasn't seen a stone pass. Staying well hydrated. Pain tolerable.   She has a history of stones. She saw Dr. Brunilda Payor. She had ESWL of a left distal stone in 2007.  She has no microscopic hematuria. Her UA today shows a few bacteria, 3-10 red blood cells, greater than 30 white cells. It will be sent for culture.  She's had no dysuria. No fever or chills.    PMH: Past Medical History:  Diagnosis Date  . Anxiety   . Depression   . Hyperlipemia   . Hypertension   . Nephrolithiasis     Surgical History: Past Surgical History:  Procedure Laterality Date  . LITHOTRIPSY  2007  . WISDOM TOOTH EXTRACTION      Home Medications:  Allergies as of 06/13/2016      Reactions   Penicillins Rash   Has patient had a PCN reaction causing immediate rash, facial/tongue/throat swelling, SOB or lightheadedness with hypotension: Yes Has patient had a PCN reaction causing severe rash involving mucus membranes or skin necrosis: No Has patient had a PCN reaction that required hospitalization: No Has patient had a PCN reaction occurring within the last 10 years: No If all of the above answers are "NO", then may proceed with Cephalosporin use.      Medication  List       Accurate as of 06/13/16 11:19 AM. Always use your most recent med list.          amphetamine-dextroamphetamine 30 MG tablet Commonly known as:  ADDERALL TK 1 T PO EVERY MORNING AND EVERY DAY AT NOON   desvenlafaxine 100 MG 24 hr tablet Commonly known as:  PRISTIQ Take 100 mg by mouth daily.   nebivolol 5 MG tablet Commonly known as:  BYSTOLIC Take 5 mg by mouth daily.   norethindrone 0.35 MG tablet Commonly known as:  MICRONOR,CAMILA,ERRIN Take 1 tablet by mouth daily.   ondansetron 4 MG tablet Commonly known as:  ZOFRAN Take 1 tablet (4 mg total) by mouth every 8 (eight) hours as needed for nausea or vomiting.   oxyCODONE-acetaminophen 5-325 MG tablet Commonly known as:  ROXICET Take 1 tablet by mouth every 4 (four) hours as needed for severe pain.   sulfamethoxazole-trimethoprim 800-160 MG tablet Commonly known as:  BACTRIM DS,SEPTRA DS Take 1 tablet by mouth 2 (two) times daily.   traZODone 50 MG tablet Commonly known as:  DESYREL Take 50 mg by mouth at bedtime.       Allergies:  Allergies  Allergen Reactions  . Penicillins Rash    Has patient had a PCN reaction causing immediate rash, facial/tongue/throat swelling, SOB or lightheadedness with hypotension: Yes Has patient had a  PCN reaction causing severe rash involving mucus membranes or skin necrosis: No Has patient had a PCN reaction that required hospitalization: No Has patient had a PCN reaction occurring within the last 10 years: No If all of the above answers are "NO", then may proceed with Cephalosporin use.     Family History: Family History  Problem Relation Age of Onset  . Breast cancer Maternal Grandmother   . Breast cancer Cousin   . Kidney cancer Neg Hx   . Prostate cancer Neg Hx     Social History:  reports that she has never smoked. She has never used smokeless tobacco. She reports that she drinks alcohol. She reports that she does not use drugs.  ROS: UROLOGY Frequent  Urination?: No Hard to postpone urination?: No Burning/pain with urination?: No Get up at night to urinate?: No Leakage of urine?: No Urine stream starts and stops?: No Trouble starting stream?: No Do you have to strain to urinate?: No Blood in urine?: No Urinary tract infection?: No Sexually transmitted disease?: No Injury to kidneys or bladder?: No Painful intercourse?: No Weak stream?: No Currently pregnant?: No Vaginal bleeding?: No Last menstrual period?: n  Gastrointestinal Nausea?: Yes Vomiting?: Yes Indigestion/heartburn?: No Diarrhea?: No Constipation?: No  Constitutional Fever: No Night sweats?: No Weight loss?: No Fatigue?: No  Skin Skin rash/lesions?: No Itching?: No  Eyes Blurred vision?: No Double vision?: No  Ears/Nose/Throat Sore throat?: No Sinus problems?: No  Hematologic/Lymphatic Swollen glands?: No Easy bruising?: No  Cardiovascular Leg swelling?: No Chest pain?: No  Respiratory Cough?: No Shortness of breath?: No  Endocrine Excessive thirst?: No  Musculoskeletal Back pain?: Yes Joint pain?: No  Neurological Headaches?: No Dizziness?: No  Psychologic Depression?: No Anxiety?: No  Physical Exam: BP (!) 143/90   Pulse 81   Ht 4\' 11"  (1.499 m)   Wt 63.5 kg (140 lb)   BMI 28.28 kg/m   Constitutional:  Alert and oriented, No acute distress. HEENT: [place] AT, moist mucus membranes.  Trachea midline, no masses. Cardiovascular: No clubbing, cyanosis, or edema. Respiratory: Normal respiratory effort, no increased work of breathing. GI: Abdomen is soft, nontender, nondistended, no abdominal masses GU: No CVA tenderness. Skin: No rashes, bruises or suspicious lesions. Lymph: No cervical or inguinal adenopathy. Neurologic: Grossly intact, no focal deficits, moving all 4 extremities. Psychiatric: Normal mood and affect.  Laboratory Data: Lab Results  Component Value Date   WBC 16.2 (H) 06/11/2016   HGB 14.2 06/11/2016    HCT 41.8 06/11/2016   MCV 89.1 06/11/2016   PLT 371 06/11/2016    Lab Results  Component Value Date   CREATININE 0.99 06/11/2016    No results found for: PSA  No results found for: TESTOSTERONE  No results found for: HGBA1C  Urinalysis    Component Value Date/Time   COLORURINE YELLOW (A) 06/11/2016 1504   APPEARANCEUR CLOUDY (A) 06/11/2016 1504   LABSPEC 1.012 06/11/2016 1504   PHURINE 7.0 06/11/2016 1504   GLUCOSEU 50 (A) 06/11/2016 1504   HGBUR NEGATIVE 06/11/2016 1504   BILIRUBINUR NEGATIVE 06/11/2016 1504   KETONESUR NEGATIVE 06/11/2016 1504   PROTEINUR NEGATIVE 06/11/2016 1504   NITRITE NEGATIVE 06/11/2016 1504   LEUKOCYTESUR LARGE (A) 06/11/2016 1504    Pertinent Imaging: CT scan   Assessment & Plan:    1. Nephrolithiasis - check KUB.  Discussed nature, risks, benefits of continued surveillance, ESWL and URS. Discussed relative success rates, complication rates and risks of staged procedure with each. Clinically she is not infected.  She had ESWL in the past and hopes to do that again.   - Urinalysis, Complete - CULTURE, URINE COMPREHENSIVE   No Follow-up on file.  Jerilee Field, MD  [facility] [address], [address] [place], [place] [address] [phone]

## 2016-06-16 ENCOUNTER — Encounter: Payer: Self-pay | Admitting: Urology

## 2016-06-16 LAB — CULTURE, URINE COMPREHENSIVE

## 2016-06-20 ENCOUNTER — Telehealth: Payer: Self-pay

## 2016-06-20 NOTE — Telephone Encounter (Signed)
-----   Message from Jerilee FieldMatthew Eskridge, MD sent at 06/19/2016  5:00 PM EDT ----- Notify patient her urine cx was but sensitive to the antibiotic she got prior shockwave lithotripsy.  As long as she has no symptoms such as bladder pain or dysuria she does not need further antibiotics.   ----- Message ----- From: Lissa HoardWatts, Desaree Downen Michelle, CMA Sent: 06/16/2016  11:49 AM To: Jerilee FieldMatthew Eskridge, MD    ----- Message ----- From: Interface, [facility] Lab Results In Sent: 06/13/2016   4:41 PM To: Jennette Kettle[facility]

## 2016-06-20 NOTE — Telephone Encounter (Signed)
Patient notified

## 2016-06-27 ENCOUNTER — Ambulatory Visit
Admission: RE | Admit: 2016-06-27 | Discharge: 2016-06-27 | Disposition: A | Discharge status: Home / Self Care | Payer: BLUE CROSS/BLUE SHIELD | Source: Ambulatory Visit | Attending: Urology | Admitting: Urology

## 2016-06-27 DIAGNOSIS — Z87442 Personal history of urinary calculi: Secondary | ICD-10-CM | POA: Insufficient documentation

## 2016-06-27 DIAGNOSIS — R935 Abnormal findings on diagnostic imaging of other abdominal regions, including retroperitoneum: Secondary | ICD-10-CM | POA: Diagnosis not present

## 2016-06-27 DIAGNOSIS — N200 Calculus of kidney: Secondary | ICD-10-CM

## 2016-06-27 NOTE — Progress Notes (Signed)
06/29/2016 4:53 PM   Angelica May December 19, 1975 [id number]  Referring provider: Dale , MD [address] [place], [place] [address]  Chief Complaint  Patient presents with  . Nephrolithiasis    Results  KUB    HPI: 41 yo WF who presents today for a 2 week follow up after ESWL.  Background history Consultation for right UPJ stone. Patient presented 2 days ago with the acute onset of right flank pain. A CT scan of the abdomen and pelvis was obtained and I thought the stone measured was a little bit overdrawn. I measured the right UPJ stone a 12 x 14 mm (? Visible on scout, HU 1250, ssd 8.5 cm). There were no other stones. Her BUN was 10 and creatinine 0.99. White blood cell count was 16 with no microscopic hematuria and white blood cells of 6-30 on the UA. Urine cx grew multiple species. She hasn't seen a stone pass. Staying well hydrated. Pain tolerable.  She has a history of stones. She saw Dr. Brunilda Payor. She had ESWL of a left distal stone in 2007.  She has no microscopic hematuria. Her UA today shows a few bacteria, 3-10 red blood cells, greater than 30 white cells. It will be sent for culture.  She's had no dysuria. No fever or chills.   Patient underwent ESWL on 06/15/2016 for a right UPJ stone.  KUB taken yesterday noted Migration of previously seen calculus into the right hemipelvis. It is uncertain whether the stone lies in the distal ureter or within the bladder. CT may be helpful for further evaluation.  She is currently not experiencing flank pain, gross hematuria or suprapubic pain.  She is not having fevers, chills, nausea or vomiting.  She has not passed a fragment.     PMH: Past Medical History:  Diagnosis Date  . Anxiety   . Depression   . Hyperlipemia   . Hypertension   . Nephrolithiasis     Surgical History: Past Surgical History:  Procedure Laterality Date  . EXTRACORPOREAL SHOCK WAVE LITHOTRIPSY Right 06/15/2016   Procedure:  EXTRACORPOREAL SHOCK WAVE LITHOTRIPSY (ESWL);  Surgeon: Vanna Scotland, MD;  Location: [facility] ORS;  Service: Urology;  Laterality: Right;  . LITHOTRIPSY  2007  . WISDOM TOOTH EXTRACTION      Home Medications:  Allergies as of 06/29/2016      Reactions   Penicillins Rash   Has patient had a PCN reaction causing immediate rash, facial/tongue/throat swelling, SOB or lightheadedness with hypotension: Yes Has patient had a PCN reaction causing severe rash involving mucus membranes or skin necrosis: No Has patient had a PCN reaction that required hospitalization: No Has patient had a PCN reaction occurring within the last 10 years: No If all of the above answers are "NO", then may proceed with Cephalosporin use.      Medication List       Accurate as of 06/29/16  4:53 PM. Always use your most recent med list.          amphetamine-dextroamphetamine 30 MG tablet Commonly known as:  ADDERALL TK 1 T PO EVERY MORNING AND EVERY DAY AT NOON   desvenlafaxine 100 MG 24 hr tablet Commonly known as:  PRISTIQ Take 100 mg by mouth daily.   docusate sodium 100 MG capsule Commonly known as:  COLACE Take 1 capsule (100 mg total) by mouth 2 (two) times daily.   HYDROcodone-acetaminophen 5-325 MG tablet Commonly known as:  NORCO/VICODIN Take 1-2 tablets by mouth every 6 (  six) hours as needed for moderate pain.   nebivolol 5 MG tablet Commonly known as:  BYSTOLIC Take 5 mg by mouth daily.   norethindrone 0.35 MG tablet Commonly known as:  MICRONOR,CAMILA,ERRIN Take 1 tablet by mouth daily.   ondansetron 4 MG tablet Commonly known as:  ZOFRAN Take 1 tablet (4 mg total) by mouth every 8 (eight) hours as needed for nausea or vomiting.   oxyCODONE-acetaminophen 5-325 MG tablet Commonly known as:  ROXICET Take 1 tablet by mouth every 4 (four) hours as needed for severe pain.   sulfamethoxazole-trimethoprim 800-160 MG tablet Commonly known as:  BACTRIM DS,SEPTRA DS Take 1 tablet by mouth 2  (two) times daily.   tamsulosin 0.4 MG Caps capsule Commonly known as:  FLOMAX Take 1 capsule (0.4 mg total) by mouth daily.   traZODone 50 MG tablet Commonly known as:  DESYREL Take 50 mg by mouth at bedtime.       Allergies:  Allergies  Allergen Reactions  . Penicillins Rash    Has patient had a PCN reaction causing immediate rash, facial/tongue/throat swelling, SOB or lightheadedness with hypotension: Yes Has patient had a PCN reaction causing severe rash involving mucus membranes or skin necrosis: No Has patient had a PCN reaction that required hospitalization: No Has patient had a PCN reaction occurring within the last 10 years: No If all of the above answers are "NO", then may proceed with Cephalosporin use.     Family History: Family History  Problem Relation Age of Onset  . Breast cancer Maternal Grandmother   . Breast cancer Cousin   . Prostate cancer Paternal Uncle   . Kidney cancer Neg Hx     Social History:  reports that she has never smoked. She has never used smokeless tobacco. She reports that she drinks alcohol. She reports that she does not use drugs.  ROS: UROLOGY Frequent Urination?: No Hard to postpone urination?: No Burning/pain with urination?: No Get up at night to urinate?: No Leakage of urine?: No Urine stream starts and stops?: No Trouble starting stream?: No Do you have to strain to urinate?: No Blood in urine?: No Urinary tract infection?: No Sexually transmitted disease?: No Injury to kidneys or bladder?: No Painful intercourse?: No Weak stream?: No Currently pregnant?: No Vaginal bleeding?: No Last menstrual period?: n  Gastrointestinal Nausea?: No Vomiting?: No Indigestion/heartburn?: No Diarrhea?: No Constipation?: No  Constitutional Fever: No Night sweats?: No Weight loss?: No Fatigue?: No  Skin Skin rash/lesions?: No Itching?: No  Eyes Blurred vision?: No Double vision?: No  Ears/Nose/Throat Sore throat?:  No Sinus problems?: No  Hematologic/Lymphatic Swollen glands?: No Easy bruising?: No  Cardiovascular Leg swelling?: No Chest pain?: No  Respiratory Cough?: No Shortness of breath?: No  Endocrine Excessive thirst?: No  Musculoskeletal Back pain?: No Joint pain?: No  Neurological Headaches?: No Dizziness?: No  Psychologic Depression?: No Anxiety?: No  Physical Exam: BP (!) 145/91   Pulse 87   Ht 4\' 11"  (1.499 m)   Wt 154 lb 6.4 oz (70 kg)   BMI 31.19 kg/m   Constitutional:  Alert and oriented, No acute distress. HEENT: [place] AT, moist mucus membranes.  Trachea midline, no masses. Cardiovascular: No clubbing, cyanosis, or edema. Respiratory: Normal respiratory effort, no increased work of breathing. GI: Abdomen is soft, nontender, nondistended, no abdominal masses GU: No CVA tenderness. Skin: No rashes, bruises or suspicious lesions. Lymph: No cervical or inguinal adenopathy. Neurologic: Grossly intact, no focal deficits, moving all 4 extremities. Psychiatric: Normal mood  and affect.  Laboratory Data: Lab Results  Component Value Date   WBC 16.2 (H) 06/11/2016   HGB 14.2 06/11/2016   HCT 41.8 06/11/2016   MCV 89.1 06/11/2016   PLT 371 06/11/2016    Lab Results  Component Value Date   CREATININE 0.99 06/11/2016      Pertinent Imaging:  CLINICAL DATA:  Right flank pain. Personal history of nephrolithiasis.  EXAM: CT ABDOMEN AND PELVIS WITHOUT CONTRAST  TECHNIQUE: Multidetector CT imaging of the abdomen and pelvis was performed following the standard protocol without IV contrast.  COMPARISON:  CT of the abdomen pelvis 08/21/2005  FINDINGS: Lower chest: The lung bases are clear without focal nodule, mass, or airspace disease.  Hepatobiliary: The hypo dense lesion in the right lobe of the liver measures 5 mm, likely a benign cyst. No other focal hepatic lesions are present. The common bile duct and gallbladder are normal.  Pancreas: The  pancreas is within normal limits.  Spleen: None.  Adrenals/Urinary Tract: The adrenal glands are normal bilaterally obstructing stone within the right renal collecting system measures 11.5 x 11.5 x 15 mm. A punctate nonobstructing stone is evident at the lower pole of the right kidney. No additional stones are present. The right ureter is otherwise within normal limits. The left kidney in ureter are unremarkable. The urinary bladder is within normal limits. There is some stranding about the right kidney.  Stomach/Bowel: The stomach and duodenum are within normal limits. Small bowel is unremarkable. The appendix is visualized and normal. The ascending and transverse colon are within normal limits. The descending colon is unremarkable.  Vascular/Lymphatic: No significant vascular findings are present. No enlarged abdominal or pelvic lymph nodes.  Reproductive: Uterus and bilateral adnexa are unremarkable.  Other: No abdominal wall hernia or abnormality. No abdominopelvic ascites.  Musculoskeletal: Slight rightward curvature is present in the lumbar spine. No focal lytic or blastic lesions are present. The pelvis is within normal limits. The hips are located bilaterally.  IMPRESSION: 1. Obstructing of right renal collecting system stone measures 11.5 x 11.5 x 15.0 mm. 2. Moderate right-sided hydronephrosis. 3. Additional punctate nonobstructing stone at the lower pole of the right kidney. 4. No significant left-sided nephrolithiasis.   Electronically Signed   By: Marin Roberts M.D.   On: 06/11/2016 18:38   Assessment & Plan:    1. Right ureteral stone  - patient is leaving to go out of town and I would like to confirm the presence or absence of the stone  - obtain CT Renal stone study  - will call patient will results     Return for I will call patient with results.  Michiel Cowboy, PA-C  [facility] [address], [address] [place], [place] [address] [phone]

## 2016-06-29 ENCOUNTER — Encounter: Payer: Self-pay | Admitting: Urology

## 2016-06-29 ENCOUNTER — Ambulatory Visit (INDEPENDENT_AMBULATORY_CARE_PROVIDER_SITE_OTHER): Payer: BLUE CROSS/BLUE SHIELD | Admitting: Urology

## 2016-06-29 VITALS — BP 145/91 | HR 87 | Ht 59.0 in | Wt 154.4 lb

## 2016-06-29 DIAGNOSIS — N201 Calculus of ureter: Secondary | ICD-10-CM | POA: Diagnosis not present

## 2016-06-30 ENCOUNTER — Ambulatory Visit: Payer: BLUE CROSS/BLUE SHIELD

## 2016-06-30 ENCOUNTER — Telehealth: Payer: Self-pay | Admitting: Urology

## 2016-06-30 LAB — PREGNANCY, URINE: PREG TEST UR: NEGATIVE

## 2016-06-30 NOTE — Telephone Encounter (Signed)
Do you know when the patient is having her CT scan today?

## 2016-07-03 ENCOUNTER — Ambulatory Visit
Admission: RE | Admit: 2016-07-03 | Discharge: 2016-07-03 | Disposition: A | Discharge status: Home / Self Care | Payer: BLUE CROSS/BLUE SHIELD | Source: Ambulatory Visit | Attending: Urology | Admitting: Urology

## 2016-07-03 ENCOUNTER — Telehealth: Payer: Self-pay

## 2016-07-03 DIAGNOSIS — N201 Calculus of ureter: Secondary | ICD-10-CM | POA: Insufficient documentation

## 2016-07-03 NOTE — Telephone Encounter (Signed)
Spoke to patient. Gave results and instructions. Patient verbalized understanding. Says she will call to have order placed for RUS when she returns.

## 2016-07-03 NOTE — Telephone Encounter (Signed)
-----   Message from Harle BattiestShannon A McGowan, PA-C sent at 07/03/2016 10:42 AM EDT ----- Please let the patient know that her CT demonstrates that the stone has passed.  She will need a follow up RUS when she returns from vacation as the kidney is still slightly swollen.

## 2016-07-14 ENCOUNTER — Other Ambulatory Visit: Payer: Self-pay | Admitting: Family Medicine

## 2016-07-14 ENCOUNTER — Telehealth: Payer: Self-pay | Admitting: Urology

## 2016-07-14 DIAGNOSIS — N201 Calculus of ureter: Secondary | ICD-10-CM

## 2016-07-14 NOTE — Telephone Encounter (Signed)
I received a call today from the patient and she said she wanted to get her RUS scheduled. Can we get an order for this so that I can get it authorized and set up for her please and thank you.   Marcelino DusterMichelle

## 2016-07-14 NOTE — Telephone Encounter (Signed)
Done

## 2016-07-14 NOTE — Telephone Encounter (Signed)
Patient is ready to have her RUS done and Lyla SonCarrie put the order in can  You please sign it when you get a chance/  Thanks,  Marcelino DusterMichelle

## 2016-07-27 ENCOUNTER — Ambulatory Visit: Payer: Self-pay | Admitting: Internal Medicine

## 2016-07-27 ENCOUNTER — Ambulatory Visit
Admission: RE | Admit: 2016-07-27 | Discharge: 2016-07-27 | Disposition: A | Discharge status: Home / Self Care | Payer: BLUE CROSS/BLUE SHIELD | Source: Ambulatory Visit | Attending: Urology | Admitting: Urology

## 2016-07-27 DIAGNOSIS — N132 Hydronephrosis with renal and ureteral calculous obstruction: Secondary | ICD-10-CM | POA: Diagnosis not present

## 2016-07-27 DIAGNOSIS — N201 Calculus of ureter: Secondary | ICD-10-CM

## 2016-08-03 ENCOUNTER — Telehealth: Payer: Self-pay

## 2016-08-03 DIAGNOSIS — N1330 Unspecified hydronephrosis: Secondary | ICD-10-CM

## 2016-08-03 NOTE — Telephone Encounter (Signed)
Patient notified and order placed. 

## 2016-08-03 NOTE — Telephone Encounter (Signed)
-----   Message from Harle BattiestShannon A McGowan, PA-C sent at 08/02/2016 10:55 PM EDT ----- Please let Angelica May know that her ultrasound shows that her right kidney is still slightly swollen.  She will need a repeated ultrasound in one month.

## 2016-09-07 ENCOUNTER — Ambulatory Visit
Admission: RE | Admit: 2016-09-07 | Discharge: 2016-09-07 | Disposition: A | Discharge status: Home / Self Care | Payer: BLUE CROSS/BLUE SHIELD | Source: Ambulatory Visit | Attending: Urology | Admitting: Urology

## 2016-09-07 DIAGNOSIS — N132 Hydronephrosis with renal and ureteral calculous obstruction: Secondary | ICD-10-CM | POA: Diagnosis not present

## 2016-09-07 DIAGNOSIS — N1330 Unspecified hydronephrosis: Secondary | ICD-10-CM

## 2016-09-08 ENCOUNTER — Telehealth: Payer: Self-pay

## 2016-09-08 NOTE — Telephone Encounter (Signed)
Please mail to this address:  [address],[place] [phone]

## 2016-09-08 NOTE — Telephone Encounter (Signed)
LMOM

## 2016-09-08 NOTE — Telephone Encounter (Signed)
-----   Message from Harle BattiestShannon A McGowan, PA-C sent at 09/08/2016  8:11 AM EDT ----- Please let Angelica May know that her right kidney continues to improve.  We can pursue a 24 hour urine for further evaluation as to why she is forming stones at this time.  Otherwise, I would like the RUS repeated in 6 months to ensure the right kidney is stable.

## 2016-09-08 NOTE — Telephone Encounter (Signed)
Pt is interested in litholink.  Please order.

## 2016-09-11 NOTE — Telephone Encounter (Signed)
Order faxed.

## 2016-09-25 ENCOUNTER — Ambulatory Visit: Payer: Self-pay | Admitting: Internal Medicine

## 2016-11-09 ENCOUNTER — Ambulatory Visit: Payer: Self-pay | Admitting: Internal Medicine

## 2017-01-18 ENCOUNTER — Other Ambulatory Visit: Payer: Self-pay | Admitting: Internal Medicine

## 2017-01-30 ENCOUNTER — Ambulatory Visit: Payer: Self-pay | Admitting: Internal Medicine

## 2017-04-12 ENCOUNTER — Ambulatory Visit: Payer: BLUE CROSS/BLUE SHIELD | Admitting: Internal Medicine

## 2017-04-12 ENCOUNTER — Encounter: Payer: Self-pay | Admitting: Internal Medicine

## 2017-04-12 DIAGNOSIS — I10 Essential (primary) hypertension: Secondary | ICD-10-CM | POA: Diagnosis not present

## 2017-04-12 DIAGNOSIS — Z1239 Encounter for other screening for malignant neoplasm of breast: Secondary | ICD-10-CM

## 2017-04-12 DIAGNOSIS — N200 Calculus of kidney: Secondary | ICD-10-CM

## 2017-04-12 DIAGNOSIS — Z124 Encounter for screening for malignant neoplasm of cervix: Secondary | ICD-10-CM | POA: Diagnosis not present

## 2017-04-12 DIAGNOSIS — L299 Pruritus, unspecified: Secondary | ICD-10-CM | POA: Diagnosis not present

## 2017-04-12 DIAGNOSIS — N912 Amenorrhea, unspecified: Secondary | ICD-10-CM | POA: Diagnosis not present

## 2017-04-12 DIAGNOSIS — Z1231 Encounter for screening mammogram for malignant neoplasm of breast: Secondary | ICD-10-CM

## 2017-04-12 LAB — LIPID PANEL
CHOLESTEROL: 175 mg/dL (ref 0–200)
HDL: 61.1 mg/dL (ref >39.00)
LDL Cholesterol: 102 mg/dL — ABNORMAL HIGH (ref 0–99)
NonHDL: 113.7
TRIGLYCERIDES: 61 mg/dL (ref 0.0–149.0)
Total CHOL/HDL Ratio: 3
VLDL: 12.2 mg/dL (ref 0.0–40.0)

## 2017-04-12 LAB — TSH: TSH: 1.5 u[IU]/mL (ref 0.35–4.50)

## 2017-04-12 LAB — CBC WITH DIFFERENTIAL/PLATELET
Basophils Absolute: 0 10*3/uL (ref 0.0–0.1)
Basophils Relative: 0.3 % (ref 0.0–3.0)
EOS ABS: 0.7 10*3/uL (ref 0.0–0.7)
Eosinophils Relative: 7 % — ABNORMAL HIGH (ref 0.0–5.0)
HEMATOCRIT: 40.8 % (ref 36.0–46.0)
HEMOGLOBIN: 13.8 g/dL (ref 12.0–15.0)
LYMPHS PCT: 30.9 % (ref 12.0–46.0)
Lymphs Abs: 3 10*3/uL (ref 0.7–4.0)
MCHC: 33.8 g/dL (ref 30.0–36.0)
MCV: 89.8 fl (ref 78.0–100.0)
MONOS PCT: 7.7 % (ref 3.0–12.0)
Monocytes Absolute: 0.7 10*3/uL (ref 0.1–1.0)
Neutro Abs: 5.2 10*3/uL (ref 1.4–7.7)
Neutrophils Relative %: 54.1 % (ref 43.0–77.0)
Platelets: 347 10*3/uL (ref 150.0–400.0)
RBC: 4.54 Mil/uL (ref 3.87–5.11)
RDW: 13.2 % (ref 11.5–15.5)
WBC: 9.7 10*3/uL (ref 4.0–10.5)

## 2017-04-12 LAB — COMPREHENSIVE METABOLIC PANEL
ALBUMIN: 4.4 g/dL (ref 3.5–5.2)
ALT: 18 U/L (ref 0–35)
AST: 17 U/L (ref 0–37)
Alkaline Phosphatase: 67 U/L (ref 39–117)
BILIRUBIN TOTAL: 0.5 mg/dL (ref 0.2–1.2)
BUN: 13 mg/dL (ref 6–23)
CALCIUM: 9.3 mg/dL (ref 8.4–10.5)
CHLORIDE: 103 meq/L (ref 96–112)
CO2: 27 mEq/L (ref 19–32)
Creatinine, Ser: 0.87 mg/dL (ref 0.40–1.20)
GFR: 75.59 mL/min (ref >60.00)
Glucose, Bld: 74 mg/dL (ref 70–99)
Potassium: 4 mEq/L (ref 3.5–5.1)
Sodium: 137 mEq/L (ref 135–145)
Total Protein: 7.8 g/dL (ref 6.0–8.3)

## 2017-04-12 LAB — FOLLICLE STIMULATING HORMONE: FSH: 10.9 m[IU]/mL

## 2017-04-12 MED ORDER — AMLODIPINE BESYLATE 5 MG PO TABS: 5.0000 mg | ORAL_TABLET | Freq: Every day | ORAL | 1 refills | Status: DC

## 2017-04-12 NOTE — Patient Instructions (Signed)
eucerin cream - apply to skin daily.

## 2017-04-12 NOTE — Progress Notes (Signed)
Patient ID: Angelica May, female   DOB: January 30, 1975, 42 y.o.   MRN: [id number]   Subjective:    Patient ID: Angelica May, female    DOB: Jun 25, 1975, 42 y.o.   MRN: [id number]  HPI  Patient here to reestablish care.  Former pt of mine at [employer].  She states she has been doing relatively well.  [facility] been [facility] for her physicals (breast and pelvic/pap smears).  Is up to date.  They have been refilling her ocp's.  She is on micronor.  Has a history of high blood pressure.  States blood pressure has been elevated recently.  Has been on bystolic.  No chest pain.  No sob.  No acid reflux.  No abdominal pain.  Bowels moving.  States blood pressure is averaging 130/80.  Reports some occasional itching of her breasts.  No rash.  No knots palpated.  No pain.  Has started water aerobics and is trying to watch her diet.  Has had problems with kidney stones recently.  S/p lithotripsy.  Has adjusted her diet.  Cut back on sweet tea, etc.  Overall doing well from a urological standpoint.  Seeing Dr Evelene Croon for depression.  Doing well on her current regimen.     Past Medical History:  Diagnosis Date  . Anxiety   . Depression   . Hyperlipemia   . Hypertension   . Nephrolithiasis    Past Surgical History:  Procedure Laterality Date  . EXTRACORPOREAL SHOCK WAVE LITHOTRIPSY Right 06/15/2016   Procedure: EXTRACORPOREAL SHOCK WAVE LITHOTRIPSY (ESWL);  Surgeon: Vanna Scotland, MD;  Location: [facility] ORS;  Service: Urology;  Laterality: Right;  . LITHOTRIPSY  2007  . LITHOTRIPSY  06/15/2016  . WISDOM TOOTH EXTRACTION     Family History  Problem Relation Age of Onset  . Breast cancer Maternal Grandmother   . Breast cancer Cousin   . Prostate cancer Paternal Uncle   . Asthma Mother   . Depression Mother   . Diabetes Mother   . Hypertension Mother   . Depression Father   . Hyperlipidemia Father   . Hypertension Father   . Stroke Father   . Alcohol abuse Brother   . Depression  Brother   . Hypertension Brother   . Hyperlipidemia Brother   . Hypertension Brother   . Hyperlipidemia Brother   . Hyperlipidemia Paternal Grandmother   . Hypertension Paternal Grandmother   . Kidney disease Paternal Grandmother   . Stroke Paternal Grandmother   . Kidney cancer Neg Hx    Social History   Socioeconomic History  . Marital status: Single    Spouse name: Not on file  . Number of children: Not on file  . Years of education: Not on file  . Highest education level: Not on file  Occupational History  . Not on file  Social Needs  . Financial resource strain: Not on file  . Food insecurity:    Worry: Not on file    Inability: Not on file  . Transportation needs:    Medical: Not on file    Non-medical: Not on file  Tobacco Use  . Smoking status: Never Smoker  . Smokeless tobacco: Never Used  Substance and Sexual Activity  . Alcohol use: Yes    Comment: occ  . Drug use: No  . Sexual activity: Yes  Lifestyle  . Physical activity:    Days per week: Not on file    Minutes per session: Not on file  .  Stress: Not on file  Relationships  . Social connections:    Talks on phone: Not on file    Gets together: Not on file    Attends religious service: Not on file    Active member of club or organization: Not on file    Attends meetings of clubs or organizations: Not on file    Relationship status: Not on file  Other Topics Concern  . Not on file  Social History Narrative  . Not on file    Outpatient Encounter Medications as of 04/12/2017  Medication Sig  . amphetamine-dextroamphetamine (ADDERALL) 30 MG tablet TK 1 T PO EVERY MORNING AND EVERY DAY AT NOON  . desvenlafaxine (PRISTIQ) 100 MG 24 hr tablet Take 100 mg by mouth daily.  . nebivolol (BYSTOLIC) 5 MG tablet Take 5 mg by mouth daily.  . norethindrone (MICRONOR,CAMILA,ERRIN) 0.35 MG tablet Take 1 tablet by mouth daily.  . traZODone (DESYREL) 50 MG tablet Take 50 mg by mouth at bedtime.  Marland Kitchen. amLODipine  (NORVASC) 5 MG tablet Take 1 tablet (5 mg total) by mouth daily.  . [DISCONTINUED] docusate sodium (COLACE) 100 MG capsule Take 1 capsule (100 mg total) by mouth 2 (two) times daily. (Patient not taking: Reported on 06/29/2016)  . [DISCONTINUED] HYDROcodone-acetaminophen (NORCO/VICODIN) 5-325 MG tablet Take 1-2 tablets by mouth every 6 (six) hours as needed for moderate pain. (Patient not taking: Reported on 06/29/2016)  . [DISCONTINUED] ondansetron (ZOFRAN) 4 MG tablet Take 1 tablet (4 mg total) by mouth every 8 (eight) hours as needed for nausea or vomiting. (Patient not taking: Reported on 06/13/2016)  . [DISCONTINUED] oxyCODONE-acetaminophen (ROXICET) 5-325 MG tablet Take 1 tablet by mouth every 4 (four) hours as needed for severe pain. (Patient not taking: Reported on 06/13/2016)  . [DISCONTINUED] sulfamethoxazole-trimethoprim (BACTRIM DS,SEPTRA DS) 800-160 MG tablet Take 1 tablet by mouth 2 (two) times daily. (Patient not taking: Reported on 06/29/2016)  . [DISCONTINUED] tamsulosin (FLOMAX) 0.4 MG CAPS capsule Take 1 capsule (0.4 mg total) by mouth daily. (Patient not taking: Reported on 04/12/2017)   No facility-administered encounter medications on file as of 04/12/2017.     Review of Systems  Constitutional: Negative for appetite change and unexpected weight change.  HENT: Negative for congestion and sinus pressure.   Respiratory: Negative for cough, chest tightness and shortness of breath.   Cardiovascular: Negative for chest pain, palpitations and leg swelling.  Gastrointestinal: Negative for abdominal pain, diarrhea, nausea and vomiting.  Genitourinary: Negative for difficulty urinating and dysuria.  Musculoskeletal: Negative for joint swelling and myalgias.  Skin: Negative for color change and rash.       Reports itching - breasts.    Neurological: Negative for dizziness, light-headedness and headaches.  Psychiatric/Behavioral: Negative for agitation and dysphoric mood.         Objective:     Blood pressure rechecked by me:  148-150/82  Physical Exam  Constitutional: She is oriented to person, place, and time. She appears well-developed and well-nourished. No distress.  HENT:  Nose: Nose normal.  Mouth/Throat: Oropharynx is clear and moist.  Eyes: Right eye exhibits no discharge. Left eye exhibits no discharge. No scleral icterus.  Neck: Neck supple. No thyromegaly present.  Cardiovascular: Normal rate and regular rhythm.  Pulmonary/Chest: Breath sounds normal. No accessory muscle usage. No tachypnea. No respiratory distress. She has no decreased breath sounds. She has no wheezes. She has no rhonchi. Right breast exhibits no inverted nipple, no mass, no nipple discharge and no tenderness (no axillary adenopathy).  Left breast exhibits no inverted nipple, no mass, no nipple discharge and no tenderness (no axilarry adenopathy).  Abdominal: Soft. Bowel sounds are normal. There is no tenderness.  Musculoskeletal: She exhibits no edema or tenderness.  Lymphadenopathy:    She has no cervical adenopathy.  Neurological: She is alert and oriented to person, place, and time.  Skin: Skin is warm. No rash noted. No erythema.  Psychiatric: She has a normal mood and affect. Her behavior is normal.    BP (!) 148/82   Pulse 63   Temp 97.7 F (36.5 C) (Oral)   Resp 18   Wt 163 lb 6.4 oz (74.1 kg)   SpO2 98%   BMI 33.00 kg/m  Wt Readings from Last 3 Encounters:  04/12/17 163 lb 6.4 oz (74.1 kg)  06/29/16 154 lb 6.4 oz (70 kg)  06/15/16 140 lb (63.5 kg)     Lab Results  Component Value Date   WBC 9.7 04/12/2017   HGB 13.8 04/12/2017   HCT 40.8 04/12/2017   PLT 347.0 04/12/2017   GLUCOSE 74 04/12/2017   CHOL 175 04/12/2017   TRIG 61.0 04/12/2017   HDL 61.10 04/12/2017   LDLCALC 102 (H) 04/12/2017   ALT 18 04/12/2017   AST 17 04/12/2017   NA 137 04/12/2017   K 4.0 04/12/2017   CL 103 04/12/2017   CREATININE 0.87 04/12/2017   BUN 13 04/12/2017   CO2 27  04/12/2017   TSH 1.50 04/12/2017    US Renal  Result Date: 09/07/2016 CLINICAL DATA:  Right-sided hydronephrosis. Right-sided nephrolithiasis. Lithotripsy 2 months ago. EXAM: RENAL / URINARY TRACT ULTRASOUND COMPLETE COMPARISON:  Bilateral renal ultrasound 07/27/2016. CT of the abdomen and pelvis 07/03/2016. FINDINGS: Right Kidney: Length: 9.6 cm, within normal limits. Echogenicity within normal limits. Minimal residual hydronephrosis continues to improve. No shadowing stones are identified. Left Kidney: Length: 9.1 cm, within normal limits. Echogenicity within normal limits. No mass or hydronephrosis visualized. Bladder: Appears normal for degree of bladder distention. IMPRESSION: 1. Continued improvement in now minimal right-sided hydronephrosis. 2. Normal sonographic appearance of the left kidney. Electronically Signed   By: Marin Roberts M.D.   On: 09/07/2016 08:58       Assessment & Plan:   Problem List Items Addressed This Visit    Amenorrhea    Not having period on micronor.  Takes regularly.  Wants FSH checked.        Relevant Orders   FSH (Completed)   Cervical cancer screening    Up to date.  Has been followed at the health department.        Hypertension, essential    Blood pressure remaining elevated.  Add amlodipine.  Continue bystolic.  Follow.  Check metabolic panel.       Relevant Medications   amLODipine (NORVASC) 5 MG tablet   Other Relevant Orders   CBC with Differential/Platelet (Completed)   Comprehensive metabolic panel (Completed)   TSH (Completed)   Lipid panel (Completed)   Itching    Noticed itching of her breasts.  No rash.  No focal changes noted.  Some itching on her lower extremities.  Dry skin. No rash.  Trial of eucerin cream.        Nephrolithiasis    Has a history of kidney stones.  S/p lithotripsy.  Has been followed by urology.         Other Visit Diagnoses    Breast cancer screening       schedule mammogram.    Relevant  Orders     MM Digital Screening       D[place], MD

## 2017-04-13 ENCOUNTER — Encounter: Payer: Self-pay | Admitting: Internal Medicine

## 2017-04-15 ENCOUNTER — Encounter: Payer: Self-pay | Admitting: Internal Medicine

## 2017-04-15 DIAGNOSIS — N200 Calculus of kidney: Secondary | ICD-10-CM | POA: Insufficient documentation

## 2017-04-15 DIAGNOSIS — Z124 Encounter for screening for malignant neoplasm of cervix: Secondary | ICD-10-CM | POA: Insufficient documentation

## 2017-04-15 DIAGNOSIS — L299 Pruritus, unspecified: Secondary | ICD-10-CM | POA: Insufficient documentation

## 2017-04-15 NOTE — Assessment & Plan Note (Signed)
Noticed itching of her breasts.  No rash.  No focal changes noted.  Some itching on her lower extremities.  Dry skin. No rash.  Trial of eucerin cream.

## 2017-04-15 NOTE — Assessment & Plan Note (Signed)
Up to date.  Has been followed at the health department.

## 2017-04-15 NOTE — Assessment & Plan Note (Signed)
Not having period on micronor.  Takes regularly.  Wants FSH checked.

## 2017-04-15 NOTE — Assessment & Plan Note (Signed)
Blood pressure remaining elevated.  Add amlodipine.  Continue bystolic.  Follow.  Check metabolic panel.

## 2017-04-15 NOTE — Assessment & Plan Note (Signed)
Has a history of kidney stones.  S/p lithotripsy.  Has been followed by urology.

## 2017-05-03 ENCOUNTER — Other Ambulatory Visit: Payer: Self-pay | Admitting: Internal Medicine

## 2017-05-03 ENCOUNTER — Ambulatory Visit: Payer: BLUE CROSS/BLUE SHIELD | Admitting: Internal Medicine

## 2017-05-03 ENCOUNTER — Ambulatory Visit
Admission: RE | Admit: 2017-05-03 | Discharge: 2017-05-03 | Disposition: A | Discharge status: Home / Self Care | Payer: BLUE CROSS/BLUE SHIELD | Source: Ambulatory Visit | Attending: Internal Medicine | Admitting: Internal Medicine

## 2017-05-03 DIAGNOSIS — R928 Other abnormal and inconclusive findings on diagnostic imaging of breast: Secondary | ICD-10-CM

## 2017-05-03 DIAGNOSIS — Z1231 Encounter for screening mammogram for malignant neoplasm of breast: Secondary | ICD-10-CM | POA: Diagnosis present

## 2017-05-03 DIAGNOSIS — N200 Calculus of kidney: Secondary | ICD-10-CM | POA: Diagnosis not present

## 2017-05-03 DIAGNOSIS — I10 Essential (primary) hypertension: Secondary | ICD-10-CM | POA: Diagnosis not present

## 2017-05-03 DIAGNOSIS — N6489 Other specified disorders of breast: Secondary | ICD-10-CM

## 2017-05-03 DIAGNOSIS — Z1239 Encounter for other screening for malignant neoplasm of breast: Secondary | ICD-10-CM

## 2017-05-03 MED ORDER — NORETHINDRONE 0.35 MG PO TABS: 1.0000 | ORAL_TABLET | Freq: Every day | ORAL | 5 refills | Status: DC

## 2017-05-03 MED ORDER — AMLODIPINE BESYLATE 5 MG PO TABS: 5.0000 mg | ORAL_TABLET | Freq: Every day | ORAL | 5 refills | Status: DC

## 2017-05-03 NOTE — Progress Notes (Signed)
Patient ID: Angelica May, female   DOB: November 25, 1975, 42 y.o.   MRN: [id number]   Subjective:    Patient ID: Angelica May, female    DOB: 1975/01/20, 42 y.o.   MRN: [id number]  HPI  Patient here for a scheduled follow up.  Here to f/u on her blood pressure.  Was started on amlodipine last visit.  Tolerating.  Remains on bystolic.  Blood pressures doing better.  No chest pain.  No sob.  No acid reflux.  No abdominal pain.  Bowels moving.  Overall she feels she is doing well.     Past Medical History:  Diagnosis Date  . Anxiety   . Depression   . Hyperlipemia   . Hypertension   . Nephrolithiasis    Past Surgical History:  Procedure Laterality Date  . EXTRACORPOREAL SHOCK WAVE LITHOTRIPSY Right 06/15/2016   Procedure: EXTRACORPOREAL SHOCK WAVE LITHOTRIPSY (ESWL);  Surgeon: Vanna Scotland, MD;  Location: [facility] ORS;  Service: Urology;  Laterality: Right;  . LITHOTRIPSY  2007  . LITHOTRIPSY  06/15/2016  . WISDOM TOOTH EXTRACTION     Family History  Problem Relation Age of Onset  . Breast cancer Maternal Grandmother   . Breast cancer Cousin   . Prostate cancer Paternal Uncle   . Asthma Mother   . Depression Mother   . Diabetes Mother   . Hypertension Mother   . Depression Father   . Hyperlipidemia Father   . Hypertension Father   . Stroke Father   . Alcohol abuse Brother   . Depression Brother   . Hypertension Brother   . Hyperlipidemia Brother   . Hypertension Brother   . Hyperlipidemia Brother   . Hyperlipidemia Paternal Grandmother   . Hypertension Paternal Grandmother   . Kidney disease Paternal Grandmother   . Stroke Paternal Grandmother   . Kidney cancer Neg Hx    Social History   Socioeconomic History  . Marital status: Single    Spouse name: Not on file  . Number of children: Not on file  . Years of education: Not on file  . Highest education level: Not on file  Occupational History  . Not on file  Social Needs  . Financial resource strain: Not on  file  . Food insecurity:    Worry: Not on file    Inability: Not on file  . Transportation needs:    Medical: Not on file    Non-medical: Not on file  Tobacco Use  . Smoking status: Never Smoker  . Smokeless tobacco: Never Used  Substance and Sexual Activity  . Alcohol use: Yes    Comment: occ  . Drug use: No  . Sexual activity: Yes  Lifestyle  . Physical activity:    Days per week: Not on file    Minutes per session: Not on file  . Stress: Not on file  Relationships  . Social connections:    Talks on phone: Not on file    Gets together: Not on file    Attends religious service: Not on file    Active member of club or organization: Not on file    Attends meetings of clubs or organizations: Not on file    Relationship status: Not on file  Other Topics Concern  . Not on file  Social History Narrative  . Not on file    Outpatient Encounter Medications as of 05/03/2017  Medication Sig  . amLODipine (NORVASC) 5 MG tablet Take 1 tablet (5 mg total) by  mouth daily.  Marland Kitchen amphetamine-dextroamphetamine (ADDERALL) 30 MG tablet TK 1 T PO EVERY MORNING AND EVERY DAY AT NOON  . desvenlafaxine (PRISTIQ) 100 MG 24 hr tablet Take 100 mg by mouth daily.  . nebivolol (BYSTOLIC) 5 MG tablet Take 5 mg by mouth daily.  . norethindrone (MICRONOR,CAMILA,ERRIN) 0.35 MG tablet Take 1 tablet (0.35 mg total) by mouth daily.  . traZODone (DESYREL) 50 MG tablet Take 50 mg by mouth at bedtime.  . [DISCONTINUED] amLODipine (NORVASC) 5 MG tablet Take 1 tablet (5 mg total) by mouth daily.  . [DISCONTINUED] norethindrone (MICRONOR,CAMILA,ERRIN) 0.35 MG tablet Take 1 tablet by mouth daily.   No facility-administered encounter medications on file as of 05/03/2017.     Review of Systems  Constitutional: Negative for appetite change and unexpected weight change.  HENT: Negative for congestion and sinus pressure.   Respiratory: Negative for cough, chest tightness and shortness of breath.   Cardiovascular:  Negative for chest pain, palpitations and leg swelling.  Gastrointestinal: Negative for abdominal pain, diarrhea, nausea and vomiting.  Genitourinary: Negative for difficulty urinating and dysuria.  Musculoskeletal: Negative for joint swelling and myalgias.  Skin: Negative for color change and rash.  Neurological: Negative for dizziness, light-headedness and headaches.  Psychiatric/Behavioral: Negative for agitation and dysphoric mood.       Objective:     Blood pressure rechecked by me:  134/84  Physical Exam  Constitutional: She appears well-developed and well-nourished. No distress.  HENT:  Nose: Nose normal.  Mouth/Throat: Oropharynx is clear and moist.  Neck: Neck supple. No thyromegaly present.  Cardiovascular: Normal rate and regular rhythm.  Pulmonary/Chest: Breath sounds normal. No respiratory distress. She has no wheezes.  Abdominal: Soft. Bowel sounds are normal. There is no tenderness.  Musculoskeletal: She exhibits no edema or tenderness.  Lymphadenopathy:    She has no cervical adenopathy.  Skin: No rash noted. No erythema.  Psychiatric: She has a normal mood and affect. Her behavior is normal.    BP 134/84   Pulse 75   Temp 98.5 F (36.9 C) (Oral)   Resp 18   Wt 163 lb 6.4 oz (74.1 kg)   SpO2 96%   BMI 33.00 kg/m  Wt Readings from Last 3 Encounters:  05/03/17 163 lb 6.4 oz (74.1 kg)  04/12/17 163 lb 6.4 oz (74.1 kg)  06/29/16 154 lb 6.4 oz (70 kg)     Lab Results  Component Value Date   WBC 9.7 04/12/2017   HGB 13.8 04/12/2017   HCT 40.8 04/12/2017   PLT 347.0 04/12/2017   GLUCOSE 74 04/12/2017   CHOL 175 04/12/2017   TRIG 61.0 04/12/2017   HDL 61.10 04/12/2017   LDLCALC 102 (H) 04/12/2017   ALT 18 04/12/2017   AST 17 04/12/2017   NA 137 04/12/2017   K 4.0 04/12/2017   CL 103 04/12/2017   CREATININE 0.87 04/12/2017   BUN 13 04/12/2017   CO2 27 04/12/2017   TSH 1.50 04/12/2017    Mm 3d Screen Breast Bilateral  Result Date:  05/03/2017 CLINICAL DATA:  Screening. EXAM: DIGITAL SCREENING BILATERAL MAMMOGRAM WITH TOMO AND CAD COMPARISON:  Previous exam(s). ACR Breast Density Category c: The breast tissue is heterogeneously dense, which may obscure small masses. FINDINGS: In the right breast, a possible asymmetry warrants further evaluation. In the left breast, no findings suspicious for malignancy. Images were processed with CAD. IMPRESSION: Further evaluation is suggested for possible asymmetry in the right breast. RECOMMENDATION: Diagnostic mammogram and possibly ultrasound of the right  breast. (Code:[id number]) The patient will be contacted regarding the findings, and additional imaging will be scheduled. BI-RADS CATEGORY  0: Incomplete. Need additional imaging evaluation and/or prior mammograms for comparison. Electronically Signed   By: Frederico Hamman M.D.   On: 05/03/2017 09:31       Assessment & Plan:   Problem List Items Addressed This Visit    Hypertension, essential    Blood pressure doing better with addition of amlodipine.  Continue current medication regimen.  Follow pressures.  Follow metabolic panel.        Relevant Medications   amLODipine (NORVASC) 5 MG tablet   Nephrolithiasis    Has a history of kidney stones.  S/p lithotripsy.  Currently doing well.            Dale Vaughn, MD

## 2017-05-07 ENCOUNTER — Other Ambulatory Visit: Payer: Self-pay | Admitting: Internal Medicine

## 2017-05-07 ENCOUNTER — Encounter: Payer: Self-pay | Admitting: Internal Medicine

## 2017-05-07 DIAGNOSIS — R928 Other abnormal and inconclusive findings on diagnostic imaging of breast: Secondary | ICD-10-CM

## 2017-05-07 NOTE — Progress Notes (Signed)
Orders placed for f/u right breast mammogram with possible ultrasound.

## 2017-05-09 ENCOUNTER — Encounter: Payer: Self-pay | Admitting: Internal Medicine

## 2017-05-09 NOTE — Assessment & Plan Note (Signed)
Blood pressure doing better with addition of amlodipine.  Continue current medication regimen.  Follow pressures.  Follow metabolic panel.

## 2017-05-09 NOTE — Assessment & Plan Note (Signed)
Has a history of kidney stones.  S/p lithotripsy.  Currently doing well.

## 2017-05-10 ENCOUNTER — Ambulatory Visit
Admission: RE | Admit: 2017-05-10 | Discharge: 2017-05-10 | Disposition: A | Discharge status: Home / Self Care | Payer: BLUE CROSS/BLUE SHIELD | Source: Ambulatory Visit | Attending: Internal Medicine | Admitting: Internal Medicine

## 2017-05-10 ENCOUNTER — Encounter: Payer: Self-pay | Admitting: Internal Medicine

## 2017-05-10 DIAGNOSIS — R928 Other abnormal and inconclusive findings on diagnostic imaging of breast: Secondary | ICD-10-CM

## 2017-05-10 DIAGNOSIS — Z0000 Encounter for general adult medical examination without abnormal findings: Secondary | ICD-10-CM | POA: Insufficient documentation

## 2017-05-15 ENCOUNTER — Other Ambulatory Visit: Payer: BLUE CROSS/BLUE SHIELD

## 2017-05-15 ENCOUNTER — Ambulatory Visit: Payer: BLUE CROSS/BLUE SHIELD

## 2017-09-11 ENCOUNTER — Ambulatory Visit: Payer: BLUE CROSS/BLUE SHIELD | Admitting: Internal Medicine

## 2017-09-11 ENCOUNTER — Encounter: Payer: Self-pay | Admitting: Internal Medicine

## 2017-09-11 VITALS — BP 138/90 | HR 89 | Temp 97.5°F | Resp 18 | Wt 158.5 lb

## 2017-09-11 DIAGNOSIS — I10 Essential (primary) hypertension: Secondary | ICD-10-CM | POA: Diagnosis not present

## 2017-09-11 DIAGNOSIS — N912 Amenorrhea, unspecified: Secondary | ICD-10-CM

## 2017-09-11 DIAGNOSIS — T148XXA Other injury of unspecified body region, initial encounter: Secondary | ICD-10-CM | POA: Diagnosis not present

## 2017-09-11 MED ORDER — MUPIROCIN 2 % EX OINT: TOPICAL_OINTMENT | CUTANEOUS | 0 refills | Status: DC

## 2017-09-11 MED ORDER — NORETHINDRONE 0.35 MG PO TABS: 1.0000 | ORAL_TABLET | Freq: Every day | ORAL | 5 refills | Status: DC

## 2017-09-11 MED ORDER — AMLODIPINE BESYLATE 5 MG PO TABS: 5.0000 mg | ORAL_TABLET | Freq: Every day | ORAL | 5 refills | Status: DC

## 2017-09-11 NOTE — Assessment & Plan Note (Signed)
No period on micronor.

## 2017-09-11 NOTE — Assessment & Plan Note (Signed)
Blood pressure on recheck 138/88-90.  Her checks averaging 120/80.  Will continue to monitor.  Notify me if increase.  Follow metabolic panel.

## 2017-09-11 NOTE — Progress Notes (Signed)
Patient ID: Angelica May, female   DOB: 24-Jun-1975, 42 y.o.   MRN: [id number]   Subjective:    Patient ID: Angelica May, female    DOB: Sep 25, 1975, 42 y.o.   MRN: [id number]  HPI  Patient here for a scheduled follow up.  She is followed by Dr Evelene Croon for depression.  Doing well on current regimen.  Work is going well.  Is exercising. Doing water aerobics.  No chest pain.  No sob.  Taking an antihistamine..  Controlling her allergies.  No acid reflux.  No abdominal pain.  Bowels moving.  States her blood pressures are doing well.  Off bystolic.  Was dropping her pressure too low.  Now only on amlodipine.  Blood pressure averaging 120/80. Does have a few skin abrasions.     Past Medical History:  Diagnosis Date  . Anxiety   . Depression   . Hyperlipemia   . Hypertension   . Nephrolithiasis    Past Surgical History:  Procedure Laterality Date  . EXTRACORPOREAL SHOCK WAVE LITHOTRIPSY Right 06/15/2016   Procedure: EXTRACORPOREAL SHOCK WAVE LITHOTRIPSY (ESWL);  Surgeon: Vanna Scotland, MD;  Location: [facility] ORS;  Service: Urology;  Laterality: Right;  . LITHOTRIPSY  2007  . LITHOTRIPSY  06/15/2016  . WISDOM TOOTH EXTRACTION     Family History  Problem Relation Age of Onset  . Breast cancer Maternal Grandmother   . Breast cancer Cousin   . Prostate cancer Paternal Uncle   . Asthma Mother   . Depression Mother   . Diabetes Mother   . Hypertension Mother   . Depression Father   . Hyperlipidemia Father   . Hypertension Father   . Stroke Father   . Alcohol abuse Brother   . Depression Brother   . Hypertension Brother   . Hyperlipidemia Brother   . Hypertension Brother   . Hyperlipidemia Brother   . Hyperlipidemia Paternal Grandmother   . Hypertension Paternal Grandmother   . Kidney disease Paternal Grandmother   . Stroke Paternal Grandmother   . Kidney cancer Neg Hx    Social History   Socioeconomic History  . Marital status: Single    Spouse name: Not on file  . Number  of children: Not on file  . Years of education: Not on file  . Highest education level: Not on file  Occupational History  . Not on file  Social Needs  . Financial resource strain: Not on file  . Food insecurity:    Worry: Not on file    Inability: Not on file  . Transportation needs:    Medical: Not on file    Non-medical: Not on file  Tobacco Use  . Smoking status: Never Smoker  . Smokeless tobacco: Never Used  Substance and Sexual Activity  . Alcohol use: Yes    Comment: occ  . Drug use: No  . Sexual activity: Yes  Lifestyle  . Physical activity:    Days per week: Not on file    Minutes per session: Not on file  . Stress: Not on file  Relationships  . Social connections:    Talks on phone: Not on file    Gets together: Not on file    Attends religious service: Not on file    Active member of club or organization: Not on file    Attends meetings of clubs or organizations: Not on file    Relationship status: Not on file  Other Topics Concern  . Not on file  Social History Narrative  . Not on file    Outpatient Encounter Medications as of 09/11/2017  Medication Sig  . amLODipine (NORVASC) 5 MG tablet Take 1 tablet (5 mg total) by mouth daily.  Marland Kitchen amphetamine-dextroamphetamine (ADDERALL) 30 MG tablet TK 1 T PO EVERY MORNING AND EVERY DAY AT NOON  . desvenlafaxine (PRISTIQ) 100 MG 24 hr tablet Take 100 mg by mouth daily.  . norethindrone (MICRONOR,CAMILA,ERRIN) 0.35 MG tablet Take 1 tablet (0.35 mg total) by mouth daily.  . traZODone (DESYREL) 50 MG tablet Take 50 mg by mouth at bedtime.  . [DISCONTINUED] amLODipine (NORVASC) 5 MG tablet Take 1 tablet (5 mg total) by mouth daily.  . [DISCONTINUED] nebivolol (BYSTOLIC) 5 MG tablet Take 5 mg by mouth daily.  . [DISCONTINUED] norethindrone (MICRONOR,CAMILA,ERRIN) 0.35 MG tablet Take 1 tablet (0.35 mg total) by mouth daily.  . mupirocin ointment (BACTROBAN) 2 % Apply to affected area bid.   No facility-administered  encounter medications on file as of 09/11/2017.     Review of Systems  Constitutional: Negative for appetite change and unexpected weight change.       Is exercising.  Watching her diet.  Lost weight.    HENT: Negative for congestion and sinus pressure.   Respiratory: Negative for cough, chest tightness and shortness of breath.   Cardiovascular: Negative for chest pain, palpitations and leg swelling.  Gastrointestinal: Negative for abdominal pain, diarrhea, nausea and vomiting.  Genitourinary: Negative for difficulty urinating and dysuria.  Musculoskeletal: Negative for joint swelling and myalgias.  Skin: Negative for color change and rash.  Neurological: Negative for dizziness, light-headedness and headaches.  Psychiatric/Behavioral: Negative for agitation and dysphoric mood.       Objective:    Physical Exam  Constitutional: She appears well-developed and well-nourished. No distress.  HENT:  Nose: Nose normal.  Mouth/Throat: Oropharynx is clear and moist.  Neck: Neck supple. No thyromegaly present.  Cardiovascular: Normal rate and regular rhythm.  Pulmonary/Chest: Breath sounds normal. No respiratory distress. She has no wheezes.  Abdominal: Soft. Bowel sounds are normal. There is no tenderness.  Musculoskeletal: She exhibits no edema or tenderness.  Lymphadenopathy:    She has no cervical adenopathy.  Skin: No rash noted.  Small abrasion - right fore arm and left lower leg.  Some surrounding erythema - right forearm.  No pain.    Psychiatric: She has a normal mood and affect. Her behavior is normal.    BP 138/90   Pulse 89   Temp (!) 97.5 F (36.4 C) (Oral)   Resp 18   Wt 158 lb 8 oz (71.9 kg)   SpO2 97%   BMI 32.01 kg/m  Wt Readings from Last 3 Encounters:  09/11/17 158 lb 8 oz (71.9 kg)  05/03/17 163 lb 6.4 oz (74.1 kg)  04/12/17 163 lb 6.4 oz (74.1 kg)     Lab Results  Component Value Date   WBC 9.7 04/12/2017   HGB 13.8 04/12/2017   HCT 40.8 04/12/2017     PLT 347.0 04/12/2017   GLUCOSE 74 04/12/2017   CHOL 175 04/12/2017   TRIG 61.0 04/12/2017   HDL 61.10 04/12/2017   LDLCALC 102 (H) 04/12/2017   ALT 18 04/12/2017   AST 17 04/12/2017   NA 137 04/12/2017   K 4.0 04/12/2017   CL 103 04/12/2017   CREATININE 0.87 04/12/2017   BUN 13 04/12/2017   CO2 27 04/12/2017   TSH 1.50 04/12/2017    US Breast Ltd Uni Right Inc Axilla  Result Date: 05/10/2017 CLINICAL DATA:  42 year old patient recalled from recent screening mammogram for evaluation a possible asymmetry in the superior right breast. EXAM: DIGITAL DIAGNOSTIC RIGHT MAMMOGRAM WITH CAD AND TOMO ULTRASOUND RIGHT BREAST COMPARISON:  May 03, 2017 and earlier priors ACR Breast Density Category c: The breast tissue is heterogeneously dense, which may obscure small masses. FINDINGS: Focal spot compression view of the superior right breast and 90 degree lateral view of the right breast shows heterogeneously dense breast tissue without a definite mass. Mammographic images were processed with CAD. Targeted ultrasound is performed, showing heterogeneously dense breast parenchyma without a solid or cystic mass or abnormal shadowing to suggest malignancy. IMPRESSION: No evidence of malignancy in the right breast. RECOMMENDATION: Screening mammogram in one year.(Code:SM-B-01Y) I have discussed the findings and recommendations with the patient. Results were also provided in writing at the conclusion of the visit. If applicable, a reminder letter will be sent to the patient regarding the next appointment. BI-RADS CATEGORY  1: Negative. Electronically Signed   By: Britta Mccreedy M.D.   On: 05/10/2017 09:02   Mm Diag Breast Tomo Uni Right  Result Date: 05/10/2017 CLINICAL DATA:  42 year old patient recalled from recent screening mammogram for evaluation a possible asymmetry in the superior right breast. EXAM: DIGITAL DIAGNOSTIC RIGHT MAMMOGRAM WITH CAD AND TOMO ULTRASOUND RIGHT BREAST COMPARISON:  May 03, 2017 and  earlier priors ACR Breast Density Category c: The breast tissue is heterogeneously dense, which may obscure small masses. FINDINGS: Focal spot compression view of the superior right breast and 90 degree lateral view of the right breast shows heterogeneously dense breast tissue without a definite mass. Mammographic images were processed with CAD. Targeted ultrasound is performed, showing heterogeneously dense breast parenchyma without a solid or cystic mass or abnormal shadowing to suggest malignancy. IMPRESSION: No evidence of malignancy in the right breast. RECOMMENDATION: Screening mammogram in one year.(Code:SM-B-01Y) I have discussed the findings and recommendations with the patient. Results were also provided in writing at the conclusion of the visit. If applicable, a reminder letter will be sent to the patient regarding the next appointment. BI-RADS CATEGORY  1: Negative. Electronically Signed   By: Britta Mccreedy M.D.   On: 05/10/2017 09:02       Assessment & Plan:   Problem List Items Addressed This Visit    Amenorrhea    No period on micronor.        Hypertension, essential    Blood pressure on recheck 138/88-90.  Her checks averaging 120/80.  Will continue to monitor.  Notify me if increase.  Follow metabolic panel.        Relevant Medications   amLODipine (NORVASC) 5 MG tablet    Other Visit Diagnoses    Skin abrasion    -  Primary   Bactroban.  Notify me if does not resolve.         Dale Quail, MD

## 2018-01-17 ENCOUNTER — Ambulatory Visit: Payer: BLUE CROSS/BLUE SHIELD | Admitting: Internal Medicine

## 2018-01-17 ENCOUNTER — Encounter: Payer: Self-pay | Admitting: Internal Medicine

## 2018-01-17 DIAGNOSIS — R490 Dysphonia: Secondary | ICD-10-CM

## 2018-01-17 DIAGNOSIS — I10 Essential (primary) hypertension: Secondary | ICD-10-CM | POA: Diagnosis not present

## 2018-01-17 DIAGNOSIS — R928 Other abnormal and inconclusive findings on diagnostic imaging of breast: Secondary | ICD-10-CM | POA: Diagnosis not present

## 2018-01-17 MED ORDER — AMLODIPINE BESYLATE 10 MG PO TABS: 10.0000 mg | ORAL_TABLET | Freq: Every day | ORAL | 2 refills | Status: DC

## 2018-01-17 NOTE — Assessment & Plan Note (Signed)
F/u views ok.  Due for f/u mammogram in 05/2018.

## 2018-01-17 NOTE — Assessment & Plan Note (Signed)
Blood pressure elevated here.  Outside checks as outlined, but does not appear to correlate with our cuff.  Will increase amlodipine to 10mg  q day.  Follow pressures.  Get her back in soon to reassess.  rx given for labs to be drawn at outside lab.

## 2018-01-17 NOTE — Patient Instructions (Signed)
nasacort nasal spray - 2 sprays each nostril one time per day.  Do this in the evening.    pepcid 20mg  - take one tablet 30 minutes before breakfast.

## 2018-01-17 NOTE — Assessment & Plan Note (Signed)
Persistent intermittent issue.  Lungs clear.  Denies significant drainage, but reports some congestion in throat.  Trial of nasacort nasal spray and pepcid as directed.  Zyrtec as directed.  Call with update.  F/u soon to confirm hoarseness resolved.

## 2018-01-17 NOTE — Progress Notes (Signed)
Patient ID: Angelica May, female   DOB: 15-Feb-1975, 43 y.o.   MRN: [id number]   Subjective:    Patient ID: Angelica ChanceJulie H May, female    DOB: 15-Feb-1975, 43 y.o.   MRN: [id number]  HPI  Patient here for a scheduled follow up.  She reports she is doing well.  Feels good.  Is exercising.  States she is trying to watch what she eats. No chest pain.  No sob.  No acid reflux.  No abdominal pain.  Bowels moving.  No period.  Takes micronor.  Some hoarseness.  Intermittent.  Feels is allergy triggered.  Takes an otc allergy medication.  Helps.  Discussed possible relfux.  She denies any definite reflux symptoms.  Denies drainage, but does feel congestion in her throat.  She reports her pressures average 120s/80s at home.  She using a phone app to check her pressure.  We checked here and it did not correlate with our bp reading.     Past Medical History:  Diagnosis Date  . Anxiety   . Depression   . Hyperlipemia   . Hypertension   . Nephrolithiasis    Past Surgical History:  Procedure Laterality Date  . EXTRACORPOREAL SHOCK WAVE LITHOTRIPSY Right 06/15/2016   Procedure: EXTRACORPOREAL SHOCK WAVE LITHOTRIPSY (ESWL);  Surgeon: Vanna ScotlandBrandon, Ashley, MD;  Location: [facility] ORS;  Service: Urology;  Laterality: Right;  . LITHOTRIPSY  2007  . LITHOTRIPSY  06/15/2016  . WISDOM TOOTH EXTRACTION     Family History  Problem Relation Age of Onset  . Breast cancer Maternal Grandmother   . Breast cancer Cousin   . Prostate cancer Paternal Uncle   . Asthma Mother   . Depression Mother   . Diabetes Mother   . Hypertension Mother   . Depression Father   . Hyperlipidemia Father   . Hypertension Father   . Stroke Father   . Alcohol abuse Brother   . Depression Brother   . Hypertension Brother   . Hyperlipidemia Brother   . Hypertension Brother   . Hyperlipidemia Brother   . Hyperlipidemia Paternal Grandmother   . Hypertension Paternal Grandmother   . Kidney disease Paternal Grandmother   . Stroke  Paternal Grandmother   . Kidney cancer Neg Hx    Social History   Socioeconomic History  . Marital status: Single    Spouse name: Not on file  . Number of children: Not on file  . Years of education: Not on file  . Highest education level: Not on file  Occupational History  . Not on file  Social Needs  . Financial resource strain: Not on file  . Food insecurity:    Worry: Not on file    Inability: Not on file  . Transportation needs:    Medical: Not on file    Non-medical: Not on file  Tobacco Use  . Smoking status: Never Smoker  . Smokeless tobacco: Never Used  Substance and Sexual Activity  . Alcohol use: Yes    Comment: occ  . Drug use: No  . Sexual activity: Yes  Lifestyle  . Physical activity:    Days per week: Not on file    Minutes per session: Not on file  . Stress: Not on file  Relationships  . Social connections:    Talks on phone: Not on file    Gets together: Not on file    Attends religious service: Not on file    Active member of club or organization: Not on  file    Attends meetings of clubs or organizations: Not on file    Relationship status: Not on file  Other Topics Concern  . Not on file  Social History Narrative  . Not on file    Outpatient Encounter Medications as of 01/17/2018  Medication Sig  . amphetamine-dextroamphetamine (ADDERALL) 30 MG tablet TK 1 T PO EVERY MORNING AND EVERY DAY AT NOON  . desvenlafaxine (PRISTIQ) 100 MG 24 hr tablet Take 100 mg by mouth daily.  . mupirocin ointment (BACTROBAN) 2 % Apply to affected area bid.  . norethindrone (MICRONOR,CAMILA,ERRIN) 0.35 MG tablet Take 1 tablet (0.35 mg total) by mouth daily.  . traZODone (DESYREL) 50 MG tablet Take 50 mg by mouth at bedtime.  . [DISCONTINUED] amLODipine (NORVASC) 5 MG tablet Take 1 tablet (5 mg total) by mouth daily.  Marland Kitchen amLODipine (NORVASC) 10 MG tablet Take 1 tablet (10 mg total) by mouth daily.   No facility-administered encounter medications on file as of  01/17/2018.     Review of Systems  Constitutional: Negative for appetite change and unexpected weight change.  HENT: Positive for congestion. Negative for postnasal drip and sinus pressure.        Hoarseness  Respiratory: Negative for cough, chest tightness and shortness of breath.   Cardiovascular: Negative for chest pain and palpitations.  Gastrointestinal: Negative for abdominal pain, diarrhea, nausea and vomiting.  Genitourinary: Negative for difficulty urinating and dysuria.  Musculoskeletal: Negative for joint swelling and myalgias.  Skin: Negative for color change and rash.  Neurological: Negative for dizziness, light-headedness and headaches.  Psychiatric/Behavioral: Negative for agitation and dysphoric mood.       Objective:     Blood pressure rechecked by me:  144-146/94-96  Physical Exam Constitutional:      General: She is not in acute distress.    Appearance: Normal appearance.  HENT:     Nose: Nose normal. No congestion.     Mouth/Throat:     Pharynx: No oropharyngeal exudate or posterior oropharyngeal erythema.  Neck:     Musculoskeletal: Neck supple. No muscular tenderness.     Thyroid: No thyromegaly.  Cardiovascular:     Rate and Rhythm: Normal rate and regular rhythm.  Pulmonary:     Effort: No respiratory distress.     Breath sounds: Normal breath sounds. No wheezing.  Abdominal:     General: Bowel sounds are normal.     Palpations: Abdomen is soft.     Tenderness: There is no abdominal tenderness.  Musculoskeletal:        General: No swelling or tenderness.  Lymphadenopathy:     Cervical: No cervical adenopathy.  Skin:    Findings: No erythema or rash.  Neurological:     Mental Status: She is alert.  Psychiatric:        Mood and Affect: Mood normal.        Behavior: Behavior normal.     BP (!) 152/98 (BP Location: Left Arm, Patient Position: Sitting, Cuff Size: Normal)   Pulse 94   Temp 98.1 F (36.7 C)   Ht 4' 9.5" (1.461 m)   Wt 159  lb 12.8 oz (72.5 kg)   SpO2 98%   BMI 33.98 kg/m  Wt Readings from Last 3 Encounters:  01/17/18 159 lb 12.8 oz (72.5 kg)  09/11/17 158 lb 8 oz (71.9 kg)  05/03/17 163 lb 6.4 oz (74.1 kg)     Lab Results  Component Value Date   WBC 9.7 04/12/2017  HGB 13.8 04/12/2017   HCT 40.8 04/12/2017   PLT 347.0 04/12/2017   GLUCOSE 74 04/12/2017   CHOL 175 04/12/2017   TRIG 61.0 04/12/2017   HDL 61.10 04/12/2017   LDLCALC 102 (H) 04/12/2017   ALT 18 04/12/2017   AST 17 04/12/2017   NA 137 04/12/2017   K 4.0 04/12/2017   CL 103 04/12/2017   CREATININE 0.87 04/12/2017   BUN 13 04/12/2017   CO2 27 04/12/2017   TSH 1.50 04/12/2017    US Breast Ltd Uni Right Inc Axilla  Result Date: 05/10/2017 CLINICAL DATA:  43 year old patient recalled from recent screening mammogram for evaluation a possible asymmetry in the superior right breast. EXAM: DIGITAL DIAGNOSTIC RIGHT MAMMOGRAM WITH CAD AND TOMO ULTRASOUND RIGHT BREAST COMPARISON:  May 03, 2017 and earlier priors ACR Breast Density Category c: The breast tissue is heterogeneously dense, which may obscure small masses. FINDINGS: Focal spot compression view of the superior right breast and 90 degree lateral view of the right breast shows heterogeneously dense breast tissue without a definite mass. Mammographic images were processed with CAD. Targeted ultrasound is performed, showing heterogeneously dense breast parenchyma without a solid or cystic mass or abnormal shadowing to suggest malignancy. IMPRESSION: No evidence of malignancy in the right breast. RECOMMENDATION: Screening mammogram in one year.(Code:SM-B-01Y) I have discussed the findings and recommendations with the patient. Results were also provided in writing at the conclusion of the visit. If applicable, a reminder letter will be sent to the patient regarding the next appointment. BI-RADS CATEGORY  1: Negative. Electronically Signed   By: Britta Mccreedy M.D.   On: 05/10/2017 09:02   Mm Diag  Breast Tomo Uni Right  Result Date: 05/10/2017 CLINICAL DATA:  43 year old patient recalled from recent screening mammogram for evaluation a possible asymmetry in the superior right breast. EXAM: DIGITAL DIAGNOSTIC RIGHT MAMMOGRAM WITH CAD AND TOMO ULTRASOUND RIGHT BREAST COMPARISON:  May 03, 2017 and earlier priors ACR Breast Density Category c: The breast tissue is heterogeneously dense, which may obscure small masses. FINDINGS: Focal spot compression view of the superior right breast and 90 degree lateral view of the right breast shows heterogeneously dense breast tissue without a definite mass. Mammographic images were processed with CAD. Targeted ultrasound is performed, showing heterogeneously dense breast parenchyma without a solid or cystic mass or abnormal shadowing to suggest malignancy. IMPRESSION: No evidence of malignancy in the right breast. RECOMMENDATION: Screening mammogram in one year.(Code:SM-B-01Y) I have discussed the findings and recommendations with the patient. Results were also provided in writing at the conclusion of the visit. If applicable, a reminder letter will be sent to the patient regarding the next appointment. BI-RADS CATEGORY  1: Negative. Electronically Signed   By: Britta Mccreedy M.D.   On: 05/10/2017 09:02       Assessment & Plan:   Problem List Items Addressed This Visit    Abnormal mammogram    F/u views ok.  Due for f/u mammogram in 05/2018.        Hoarseness    Persistent intermittent issue.  Lungs clear.  Denies significant drainage, but reports some congestion in throat.  Trial of nasacort nasal spray and pepcid as directed.  Zyrtec as directed.  Call with update.  F/u soon to confirm hoarseness resolved.        Hypertension, essential    Blood pressure elevated here.  Outside checks as outlined, but does not appear to correlate with our cuff.  Will increase amlodipine to 10mg  q day.  Follow pressures.  Get her back in soon to reassess.  rx given for labs to  be drawn at outside lab.        Relevant Medications   amLODipine (NORVASC) 10 MG tablet       Dale [place], MD

## 2018-01-31 IMAGING — CR DG ABDOMEN 1V
2 series · 2 of 2 positions shown · non-contrast
Comparison: 06/13/2016

CLINICAL DATA: History of right-sided renal stone

EXAM:
ABDOMEN - 1 VIEW

[abdomen kub (1 of 2)]
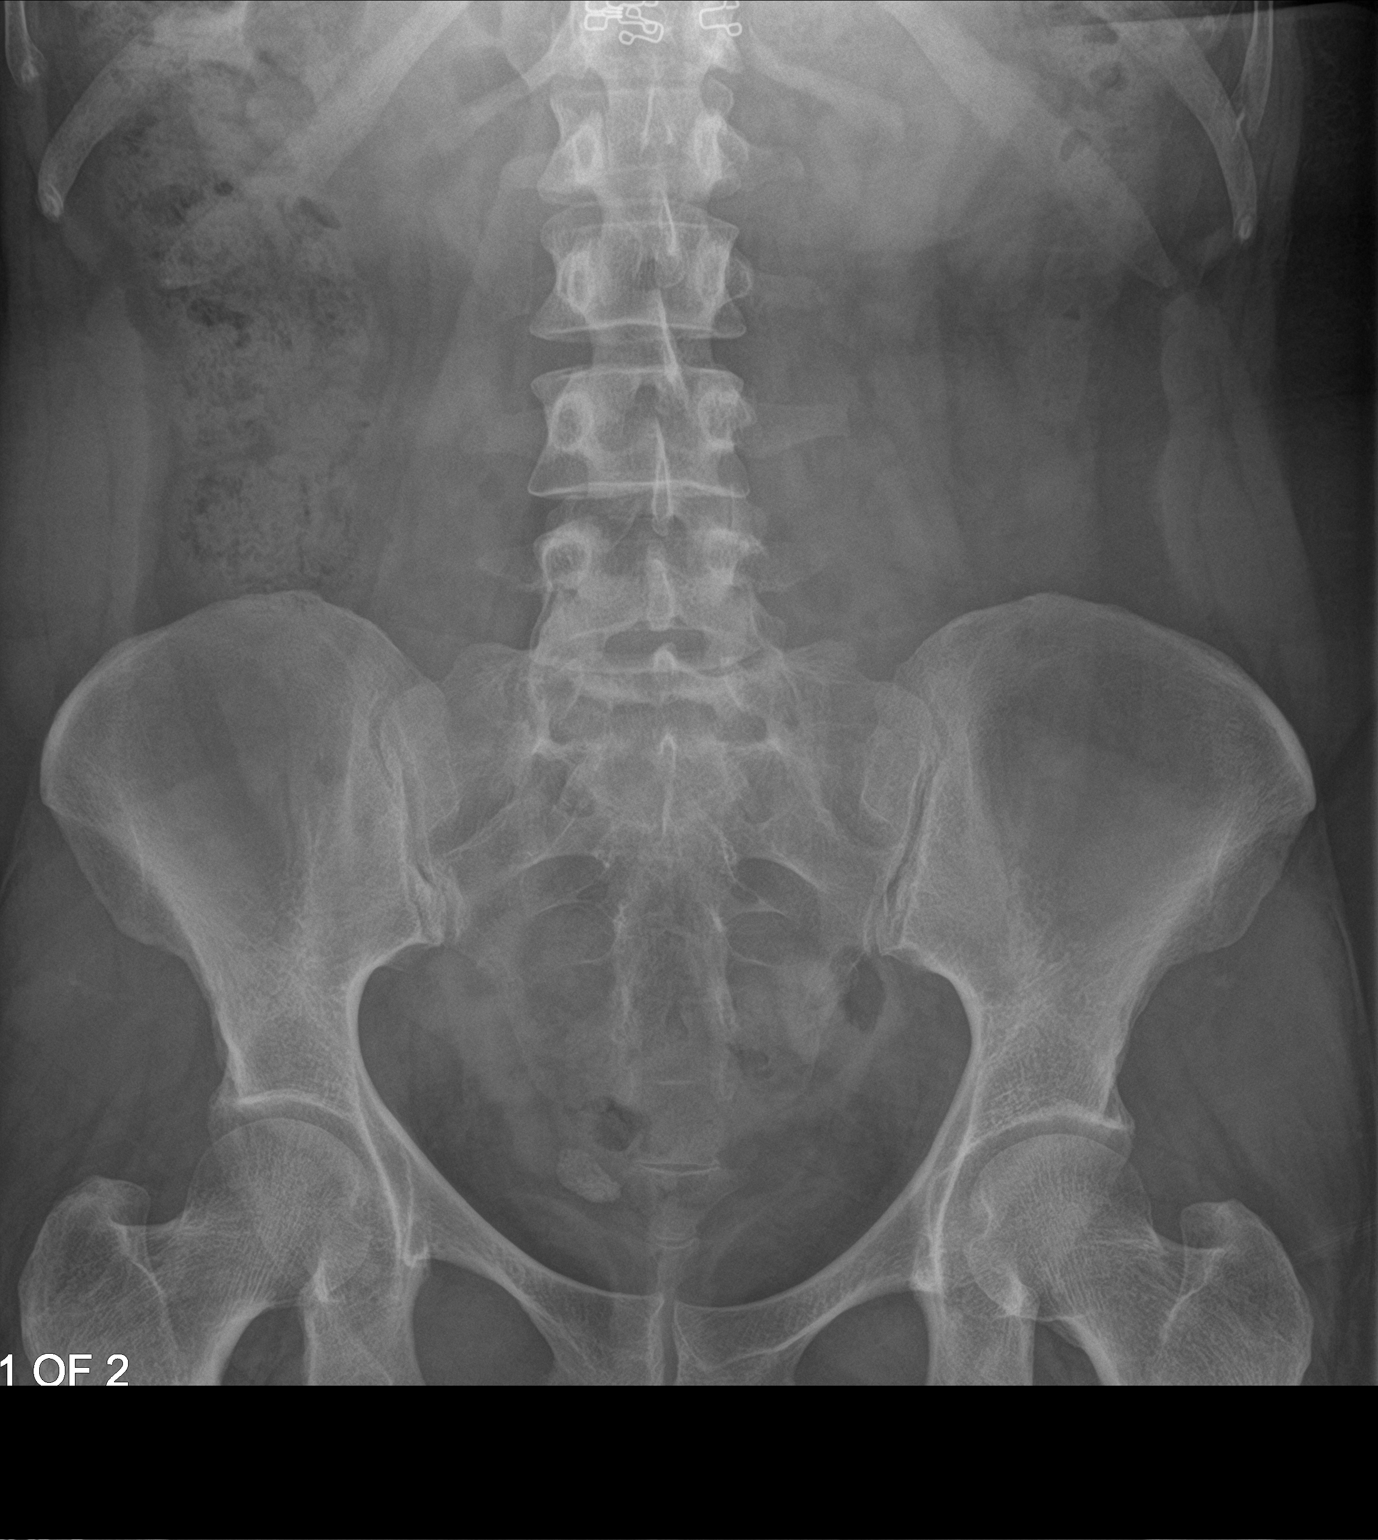

[abdomen kub (2 of 2)]
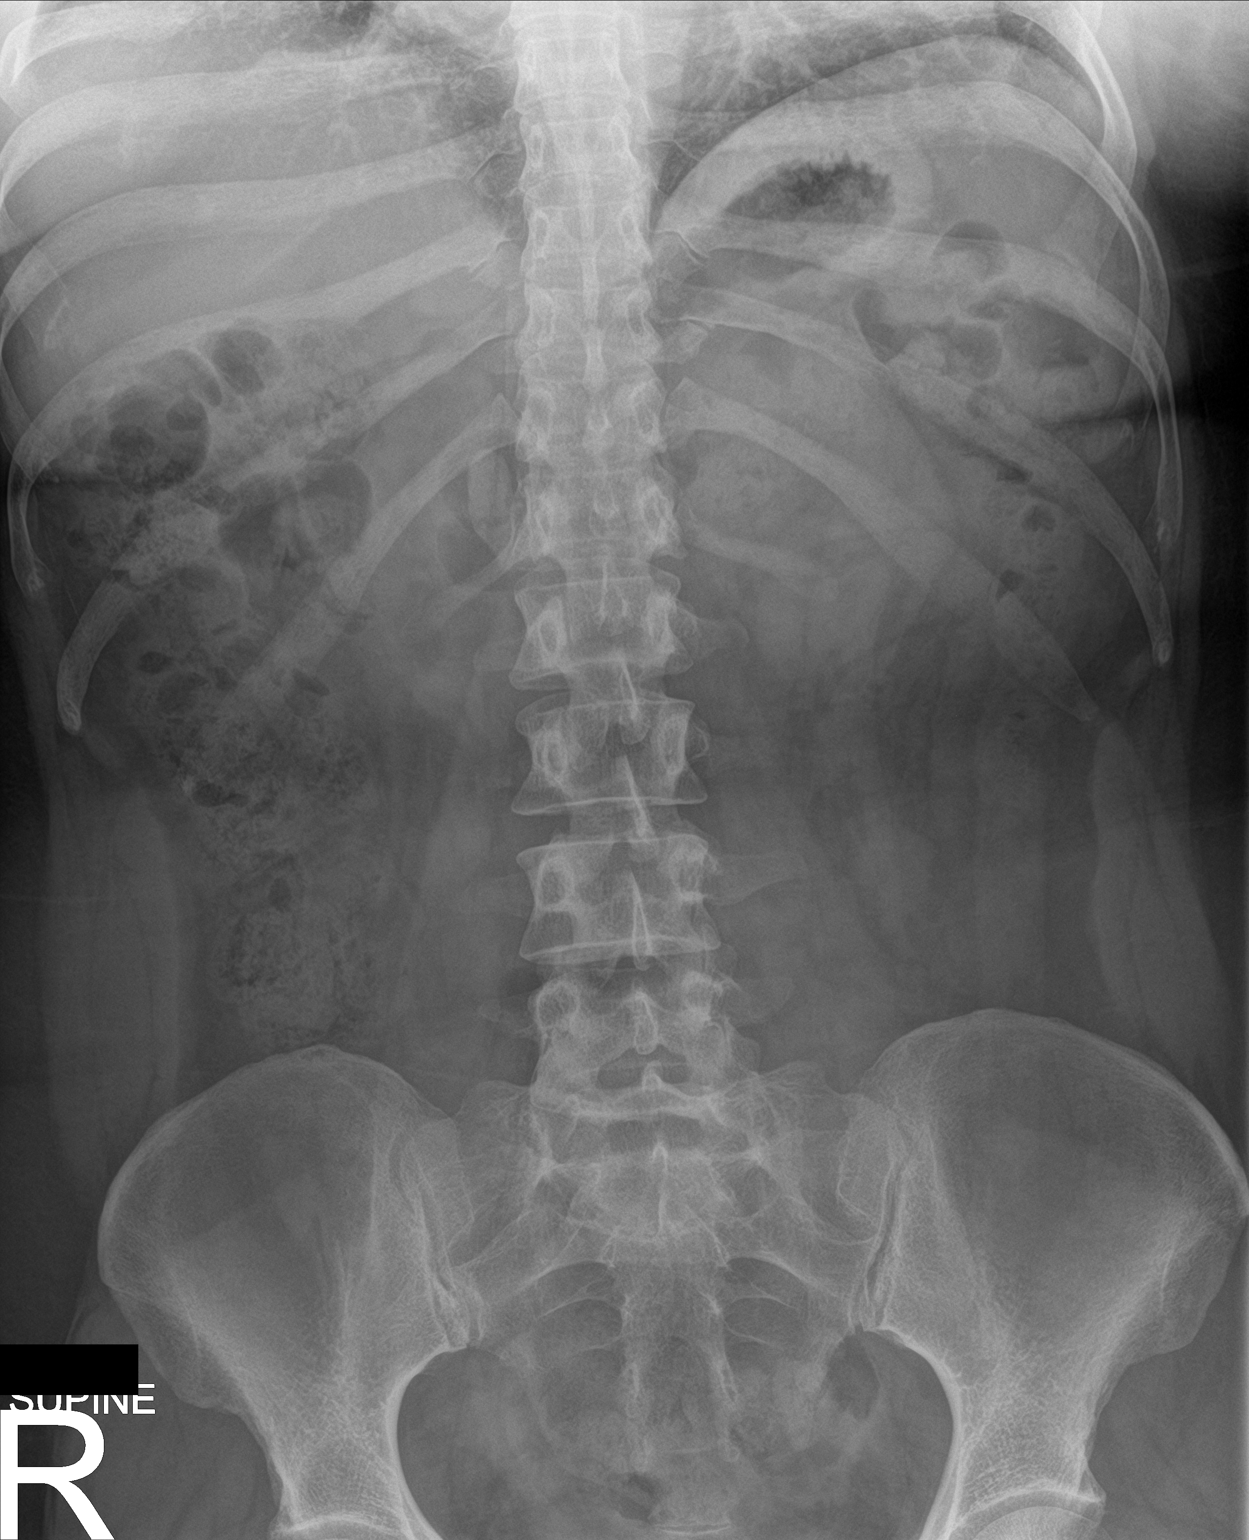

[2 of 2 positions shown; findings below may reference images not displayed]

FINDINGS: Scattered large and small bowel gas is noted. The previously seen
right renal stone has undergone subsequent lithotripsy end is no
longer identified within the renal pelvis. It has migrated into the
low right hemipelvis and may lie a within the bladder although may
be present in the distal right ureter. Clinical correlation with
patient's symptomatology is recommended. CT may be helpful for
definitive localization.
IMPRESSION: Migration of previously seen calculus into the right hemipelvis. It
is uncertain whether the stone lies in the distal ureter or within
the bladder. CT may be helpful for further evaluation.

## 2018-02-27 ENCOUNTER — Encounter: Payer: Self-pay | Admitting: Internal Medicine

## 2018-03-05 ENCOUNTER — Ambulatory Visit: Payer: BLUE CROSS/BLUE SHIELD | Admitting: Internal Medicine

## 2018-03-05 ENCOUNTER — Encounter: Payer: Self-pay | Admitting: Internal Medicine

## 2018-03-05 DIAGNOSIS — R490 Dysphonia: Secondary | ICD-10-CM | POA: Diagnosis not present

## 2018-03-05 DIAGNOSIS — I10 Essential (primary) hypertension: Secondary | ICD-10-CM | POA: Diagnosis not present

## 2018-03-05 MED ORDER — METOPROLOL SUCCINATE ER 25 MG PO TB24: ORAL_TABLET | ORAL | 1 refills | Status: DC

## 2018-03-05 MED ORDER — NORETHINDRONE 0.35 MG PO TABS: 1.0000 | ORAL_TABLET | Freq: Every day | ORAL | 5 refills | Status: DC

## 2018-03-05 NOTE — Assessment & Plan Note (Signed)
Improved.  Taking zyrtec.  Discussed nasal sprays to help with allergy symptoms.  Follow.

## 2018-03-05 NOTE — Assessment & Plan Note (Signed)
Blood pressure improved.  Outside checks as outlined.  She is concerned regarding heart rate in 90s.  On adderall.  Blood pressure still a little elevated.  Has tried toprol previously.  Will try low dose 12.5mg  toprol XL.  Follow pressures.  Get her back in soon to reassess.  She has rx for labs.  Discussed the need to have labs checked - to assess kidney function ,etc.

## 2018-03-05 NOTE — Progress Notes (Signed)
Patient ID: Angelica May, female   DOB: 27-Jan-1975, 43 y.o.   MRN: [id number]   Subjective:    Patient ID: Angelica May, female    DOB: [birth year], 43 y.o.   MRN: [id number]  HPI  Patient here for a scheduled follow up.  She reports she is doing well.  On amlodipine 10mg  q day.  Blood pressure doing better.  States outside checks averaging 125/80.  She is concerned her heart rate is staying elevated - upper 90s.  No headache.  No dizziness.  No chest pain.  No sob.  No acid reflux.  No abdominal pain.  Bowels moving.  No urine change.  Some allergy symptoms - with sneezing and drainage.  Takes zyrtec.     Past Medical History:  Diagnosis Date  . Anxiety   . Depression   . Hyperlipemia   . Hypertension   . Nephrolithiasis    Past Surgical History:  Procedure Laterality Date  . EXTRACORPOREAL SHOCK WAVE LITHOTRIPSY Right 06/15/2016   Procedure: EXTRACORPOREAL SHOCK WAVE LITHOTRIPSY (ESWL);  Surgeon: Vanna Scotland, MD;  Location: [facility] ORS;  Service: Urology;  Laterality: Right;  . LITHOTRIPSY  2007  . LITHOTRIPSY  06/15/2016  . WISDOM TOOTH EXTRACTION     Family History  Problem Relation Age of Onset  . Breast cancer Maternal Grandmother   . Breast cancer Cousin   . Prostate cancer Paternal Uncle   . Asthma Mother   . Depression Mother   . Diabetes Mother   . Hypertension Mother   . Depression Father   . Hyperlipidemia Father   . Hypertension Father   . Stroke Father   . Alcohol abuse Brother   . Depression Brother   . Hypertension Brother   . Hyperlipidemia Brother   . Hypertension Brother   . Hyperlipidemia Brother   . Hyperlipidemia Paternal Grandmother   . Hypertension Paternal Grandmother   . Kidney disease Paternal Grandmother   . Stroke Paternal Grandmother   . Kidney cancer Neg Hx    Social History   Socioeconomic History  . Marital status: Single    Spouse name: Not on file  . Number of children: Not on file  . Years of education: Not on file  .  Highest education level: Not on file  Occupational History  . Not on file  Social Needs  . Financial resource strain: Not on file  . Food insecurity:    Worry: Not on file    Inability: Not on file  . Transportation needs:    Medical: Not on file    Non-medical: Not on file  Tobacco Use  . Smoking status: Never Smoker  . Smokeless tobacco: Never Used  Substance and Sexual Activity  . Alcohol use: Yes    Comment: occ  . Drug use: No  . Sexual activity: Yes  Lifestyle  . Physical activity:    Days per week: Not on file    Minutes per session: Not on file  . Stress: Not on file  Relationships  . Social connections:    Talks on phone: Not on file    Gets together: Not on file    Attends religious service: Not on file    Active member of club or organization: Not on file    Attends meetings of clubs or organizations: Not on file    Relationship status: Not on file  Other Topics Concern  . Not on file  Social History Narrative  . Not on file  Outpatient Encounter Medications as of 03/05/2018  Medication Sig  . amLODipine (NORVASC) 10 MG tablet Take 1 tablet (10 mg total) by mouth daily.  Marland Kitchen amphetamine-dextroamphetamine (ADDERALL) 30 MG tablet TK 1 T PO EVERY MORNING AND EVERY DAY AT NOON  . desvenlafaxine (PRISTIQ) 100 MG 24 hr tablet Take 100 mg by mouth daily.  . metoprolol succinate (TOPROL XL) 25 MG 24 hr tablet 1/2 tablet q day  . mupirocin ointment (BACTROBAN) 2 % Apply to affected area bid.  . norethindrone (MICRONOR,CAMILA,ERRIN) 0.35 MG tablet Take 1 tablet (0.35 mg total) by mouth daily.  . traZODone (DESYREL) 50 MG tablet Take 50 mg by mouth at bedtime.  . [DISCONTINUED] norethindrone (MICRONOR,CAMILA,ERRIN) 0.35 MG tablet Take 1 tablet (0.35 mg total) by mouth daily.   No facility-administered encounter medications on file as of 03/05/2018.     Review of Systems  Constitutional: Negative for appetite change and unexpected weight change.  HENT: Positive for  congestion and postnasal drip. Negative for sinus pressure.   Respiratory: Negative for cough, chest tightness and shortness of breath.   Cardiovascular: Negative for chest pain, palpitations and leg swelling.  Gastrointestinal: Negative for abdominal pain, diarrhea, nausea and vomiting.  Genitourinary: Negative for difficulty urinating and dysuria.  Musculoskeletal: Negative for joint swelling and myalgias.  Skin: Negative for color change and rash.  Neurological: Negative for dizziness, light-headedness and headaches.  Psychiatric/Behavioral: Negative for agitation and dysphoric mood.       Objective:    Physical Exam Constitutional:      General: She is not in acute distress.    Appearance: Normal appearance.  HENT:     Nose: Nose normal. No congestion.     Mouth/Throat:     Pharynx: No oropharyngeal exudate or posterior oropharyngeal erythema.  Neck:     Musculoskeletal: Neck supple. No muscular tenderness.     Thyroid: No thyromegaly.  Cardiovascular:     Rate and Rhythm: Normal rate and regular rhythm.  Pulmonary:     Effort: No respiratory distress.     Breath sounds: Normal breath sounds. No wheezing.  Abdominal:     General: Bowel sounds are normal.     Palpations: Abdomen is soft.     Tenderness: There is no abdominal tenderness.  Musculoskeletal:        General: No swelling or tenderness.  Lymphadenopathy:     Cervical: No cervical adenopathy.  Skin:    Findings: No erythema or rash.  Neurological:     Mental Status: She is alert.  Psychiatric:        Mood and Affect: Mood normal.        Behavior: Behavior normal.     BP 138/88   Pulse 98   Temp 97.9 F (36.6 C) (Oral)   Resp 16   Wt 161 lb 9.6 oz (73.3 kg)   SpO2 98%   BMI 34.36 kg/m  Wt Readings from Last 3 Encounters:  03/05/18 161 lb 9.6 oz (73.3 kg)  01/17/18 159 lb 12.8 oz (72.5 kg)  09/11/17 158 lb 8 oz (71.9 kg)     Lab Results  Component Value Date   WBC 9.7 04/12/2017   HGB 13.8  04/12/2017   HCT 40.8 04/12/2017   PLT 347.0 04/12/2017   GLUCOSE 74 04/12/2017   CHOL 175 04/12/2017   TRIG 61.0 04/12/2017   HDL 61.10 04/12/2017   LDLCALC 102 (H) 04/12/2017   ALT 18 04/12/2017   AST 17 04/12/2017   NA 137 04/12/2017  K 4.0 04/12/2017   CL 103 04/12/2017   CREATININE 0.87 04/12/2017   BUN 13 04/12/2017   CO2 27 04/12/2017   TSH 1.50 04/12/2017    US Breast Ltd Uni Right Inc Axilla  Result Date: 05/10/2017 CLINICAL DATA:  43 year old patient recalled from recent screening mammogram for evaluation a possible asymmetry in the superior right breast. EXAM: DIGITAL DIAGNOSTIC RIGHT MAMMOGRAM WITH CAD AND TOMO ULTRASOUND RIGHT BREAST COMPARISON:  May 03, 2017 and earlier priors ACR Breast Density Category c: The breast tissue is heterogeneously dense, which may obscure small masses. FINDINGS: Focal spot compression view of the superior right breast and 90 degree lateral view of the right breast shows heterogeneously dense breast tissue without a definite mass. Mammographic images were processed with CAD. Targeted ultrasound is performed, showing heterogeneously dense breast parenchyma without a solid or cystic mass or abnormal shadowing to suggest malignancy. IMPRESSION: No evidence of malignancy in the right breast. RECOMMENDATION: Screening mammogram in one year.(Code:SM-B-01Y) I have discussed the findings and recommendations with the patient. Results were also provided in writing at the conclusion of the visit. If applicable, a reminder letter will be sent to the patient regarding the next appointment. BI-RADS CATEGORY  1: Negative. Electronically Signed   By: Britta Mccreedy M.D.   On: 05/10/2017 09:02   Mm Diag Breast Tomo Uni Right  Result Date: 05/10/2017 CLINICAL DATA:  43 year old patient recalled from recent screening mammogram for evaluation a possible asymmetry in the superior right breast. EXAM: DIGITAL DIAGNOSTIC RIGHT MAMMOGRAM WITH CAD AND TOMO ULTRASOUND RIGHT  BREAST COMPARISON:  May 03, 2017 and earlier priors ACR Breast Density Category c: The breast tissue is heterogeneously dense, which may obscure small masses. FINDINGS: Focal spot compression view of the superior right breast and 90 degree lateral view of the right breast shows heterogeneously dense breast tissue without a definite mass. Mammographic images were processed with CAD. Targeted ultrasound is performed, showing heterogeneously dense breast parenchyma without a solid or cystic mass or abnormal shadowing to suggest malignancy. IMPRESSION: No evidence of malignancy in the right breast. RECOMMENDATION: Screening mammogram in one year.(Code:SM-B-01Y) I have discussed the findings and recommendations with the patient. Results were also provided in writing at the conclusion of the visit. If applicable, a reminder letter will be sent to the patient regarding the next appointment. BI-RADS CATEGORY  1: Negative. Electronically Signed   By: Britta Mccreedy M.D.   On: 05/10/2017 09:02       Assessment & Plan:   Problem List Items Addressed This Visit    Hoarseness    Improved.  Taking zyrtec.  Discussed nasal sprays to help with allergy symptoms.  Follow.        Hypertension, essential    Blood pressure improved.  Outside checks as outlined.  She is concerned regarding heart rate in 90s.  On adderall.  Blood pressure still a little elevated.  Has tried toprol previously.  Will try low dose 12.5mg  toprol XL.  Follow pressures.  Get her back in soon to reassess.  She has rx for labs.  Discussed the need to have labs checked - to assess kidney function ,etc.        Relevant Medications   metoprolol succinate (TOPROL XL) 25 MG 24 hr tablet       Dale Bloxom, MD

## 2018-03-05 NOTE — Patient Instructions (Signed)
Saline nasal spray - flush nose at least 2-3x/day  nasacort nasal spray - 2 sprays each nostril one time per day.  Do this in the evening.  

## 2018-04-19 ENCOUNTER — Other Ambulatory Visit: Payer: Self-pay | Admitting: Internal Medicine

## 2018-06-26 ENCOUNTER — Other Ambulatory Visit: Payer: Self-pay | Admitting: Internal Medicine

## 2018-06-28 ENCOUNTER — Other Ambulatory Visit: Payer: Self-pay | Admitting: Internal Medicine

## 2018-06-28 DIAGNOSIS — Z1231 Encounter for screening mammogram for malignant neoplasm of breast: Secondary | ICD-10-CM

## 2018-07-01 ENCOUNTER — Telehealth: Payer: Self-pay | Admitting: Internal Medicine

## 2018-07-01 NOTE — Telephone Encounter (Signed)
Patient is calling reschedule her Appt. Patient states she will call back tomorrow. Thank you

## 2018-07-02 ENCOUNTER — Telehealth: Payer: Self-pay

## 2018-07-02 NOTE — Telephone Encounter (Signed)
Needs cbc, met c, tsh and lipid profile.  Does she just need written on a prescription?

## 2018-07-02 NOTE — Telephone Encounter (Signed)
Copied from [place] [id number]. Topic: General - Other >> Jul 02, 2018 10:36 AM Celene Kras A wrote: Reason for CRM: Pt called and is requesting a prescription for lab work be mailed to her. Pt states she will get lab work done next Tuesday and that she needs it mailed by then. Please advise.

## 2018-07-02 NOTE — Telephone Encounter (Signed)
Called and rescheduled pt.

## 2018-07-02 NOTE — Telephone Encounter (Signed)
Patient needs a script to have labs done at [facility]. She got one at her last appt but not sure where its at. Advised that We would mail the script. I am not sure what all you want drawn. Her lab appt is 7/7

## 2018-07-03 NOTE — Telephone Encounter (Signed)
rx signed and placed in box.   

## 2018-07-03 NOTE — Telephone Encounter (Signed)
Mailed to patient. Pt aware

## 2018-07-03 NOTE — Telephone Encounter (Signed)
Yes, I have written out rx and placed out for your signature

## 2018-07-09 ENCOUNTER — Other Ambulatory Visit: Payer: Self-pay | Admitting: Internal Medicine

## 2018-07-09 LAB — HM MAMMOGRAPHY

## 2018-07-10 LAB — CBC WITH DIFFERENTIAL/PLATELET
Basophils Absolute: 0 10*3/uL (ref 0.0–0.2)
Basos: 0 %
EOS (ABSOLUTE): 0.6 10*3/uL — ABNORMAL HIGH (ref 0.0–0.4)
Eos: 6 %
Hematocrit: 37.3 % (ref 34.0–46.6)
Hemoglobin: 13 g/dL (ref 11.1–15.9)
Immature Grans (Abs): 0 10*3/uL (ref 0.0–0.1)
Immature Granulocytes: 0 %
Lymphocytes Absolute: 2.4 10*3/uL (ref 0.7–3.1)
Lymphs: 27 %
MCH: 30.9 pg (ref 26.6–33.0)
MCHC: 34.9 g/dL (ref 31.5–35.7)
MCV: 89 fL (ref 79–97)
Monocytes Absolute: 0.6 10*3/uL (ref 0.1–0.9)
Monocytes: 7 %
Neutrophils Absolute: 5.3 10*3/uL (ref 1.4–7.0)
Neutrophils: 60 %
Platelets: 378 10*3/uL (ref 150–450)
RBC: 4.21 x10E6/uL (ref 3.77–5.28)
RDW: 12.4 % (ref 11.7–15.4)
WBC: 8.9 10*3/uL (ref 3.4–10.8)

## 2018-07-10 LAB — COMPREHENSIVE METABOLIC PANEL
ALT: 15 IU/L (ref 0–32)
AST: 20 IU/L (ref 0–40)
Albumin/Globulin Ratio: 1.7 (ref 1.2–2.2)
Albumin: 4.4 g/dL (ref 3.8–4.8)
Alkaline Phosphatase: 86 IU/L (ref 39–117)
BUN/Creatinine Ratio: 13 (ref 9–23)
BUN: 10 mg/dL (ref 6–24)
Bilirubin Total: 0.5 mg/dL (ref 0.0–1.2)
CO2: 24 mmol/L (ref 20–29)
Calcium: 9.4 mg/dL (ref 8.7–10.2)
Chloride: 99 mmol/L (ref 96–106)
Creatinine, Ser: 0.78 mg/dL (ref 0.57–1.00)
GFR calc Af Amer: 107 mL/min/{1.73_m2} (ref >59)
GFR calc non Af Amer: 93 mL/min/{1.73_m2} (ref >59)
Globulin, Total: 2.6 g/dL (ref 1.5–4.5)
Glucose: 100 mg/dL — ABNORMAL HIGH (ref 65–99)
Potassium: 3.7 mmol/L (ref 3.5–5.2)
Sodium: 139 mmol/L (ref 134–144)
Total Protein: 7 g/dL (ref 6.0–8.5)

## 2018-07-10 LAB — SPECIMEN STATUS REPORT

## 2018-07-10 LAB — LIPID PANEL W/O CHOL/HDL RATIO
Cholesterol, Total: 192 mg/dL (ref 100–199)
HDL: 59 mg/dL (ref >39)
LDL Calculated: 116 mg/dL — ABNORMAL HIGH (ref 0–99)
Triglycerides: 85 mg/dL (ref 0–149)
VLDL Cholesterol Cal: 17 mg/dL (ref 5–40)

## 2018-07-10 LAB — TSH: TSH: 2.73 u[IU]/mL (ref 0.450–4.500)

## 2018-07-11 ENCOUNTER — Ambulatory Visit: Payer: BLUE CROSS/BLUE SHIELD | Admitting: Internal Medicine

## 2018-07-11 ENCOUNTER — Encounter: Payer: Self-pay | Admitting: Internal Medicine

## 2018-07-23 ENCOUNTER — Other Ambulatory Visit: Payer: Self-pay | Admitting: Internal Medicine

## 2018-08-13 ENCOUNTER — Ambulatory Visit: Payer: BLUE CROSS/BLUE SHIELD

## 2018-08-15 ENCOUNTER — Other Ambulatory Visit: Payer: Self-pay | Admitting: Internal Medicine

## 2018-08-16 NOTE — Telephone Encounter (Signed)
lastt PAP 2018 ok to fill?

## 2018-09-05 ENCOUNTER — Ambulatory Visit: Payer: BLUE CROSS/BLUE SHIELD | Admitting: Internal Medicine

## 2018-10-18 ENCOUNTER — Other Ambulatory Visit: Payer: Self-pay | Admitting: Internal Medicine

## 2018-10-23 IMAGING — US US RENAL
1 series · 14 of 25 positions shown · non-contrast
Comparison: Bilateral renal ultrasound 07/27/2016. CT of the
abdomen and pelvis 07/03/2016.

CLINICAL DATA: Right-sided hydronephrosis. Right-sided
nephrolithiasis. Lithotripsy 2 months ago.

EXAM:
RENAL / URINARY TRACT ULTRASOUND COMPLETE

[Series 1: us renal · 0.22mm/px · 14 of 27 slices shown]
[im 1/27]
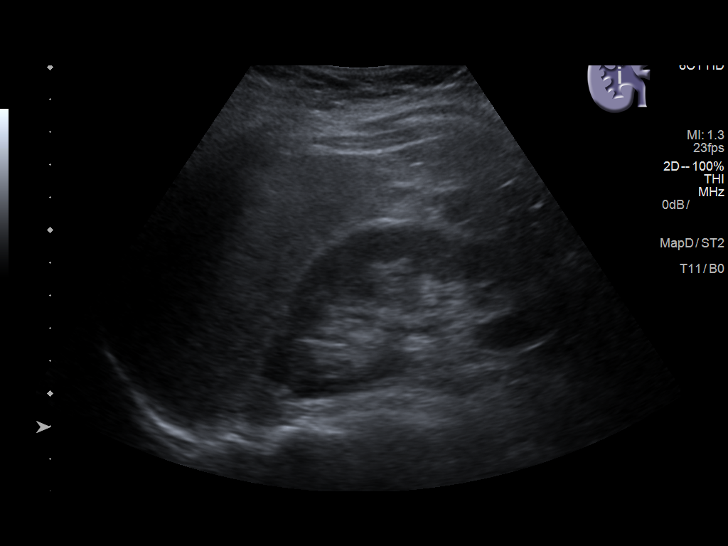
[im 3/27]
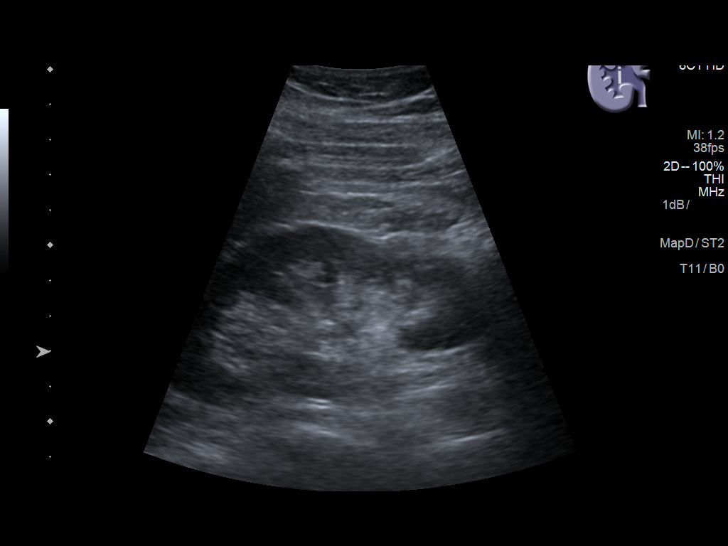
[im 5/27]
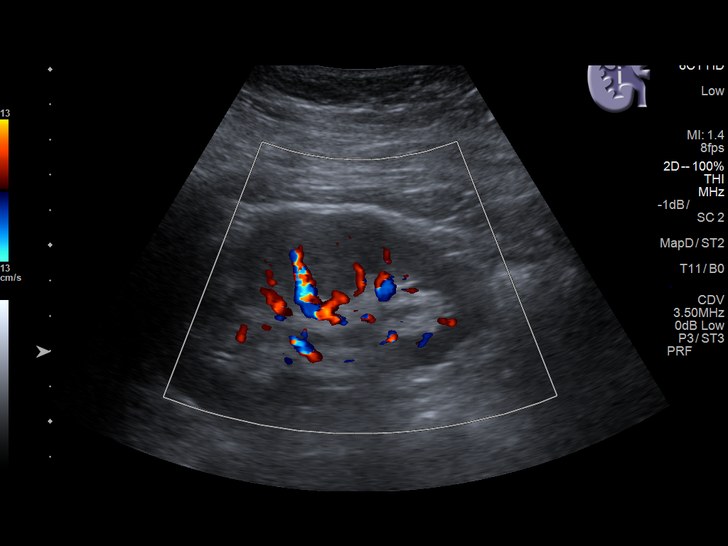
[im 7/27]
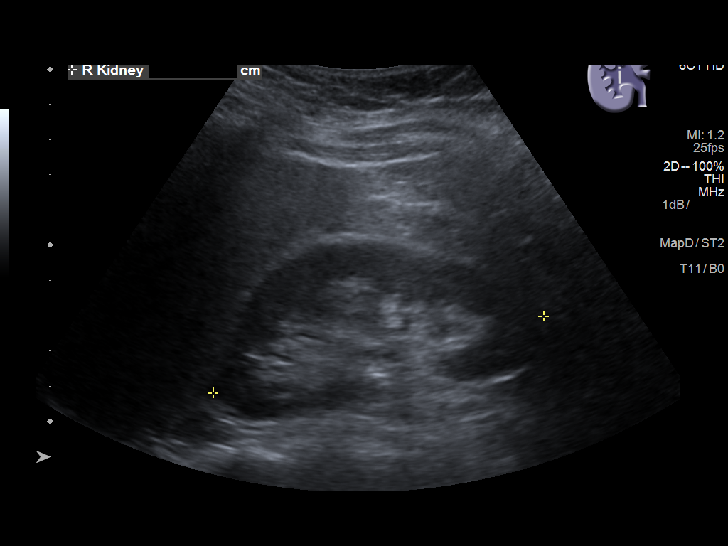
[im 9/27]
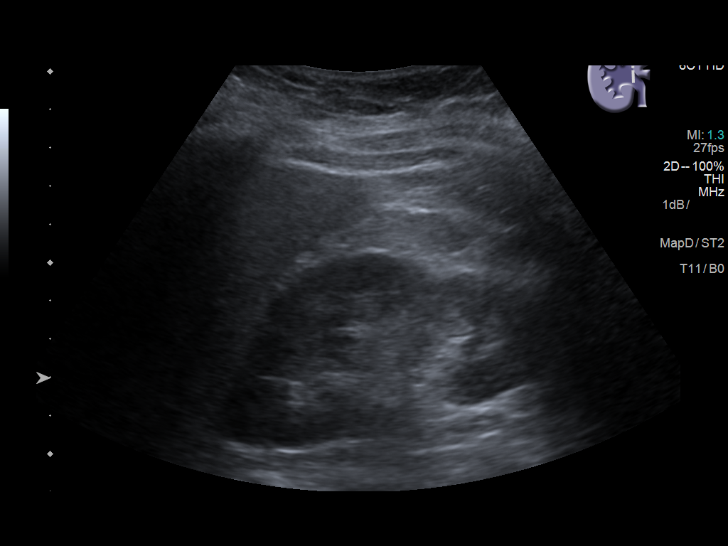
[im 10/27]
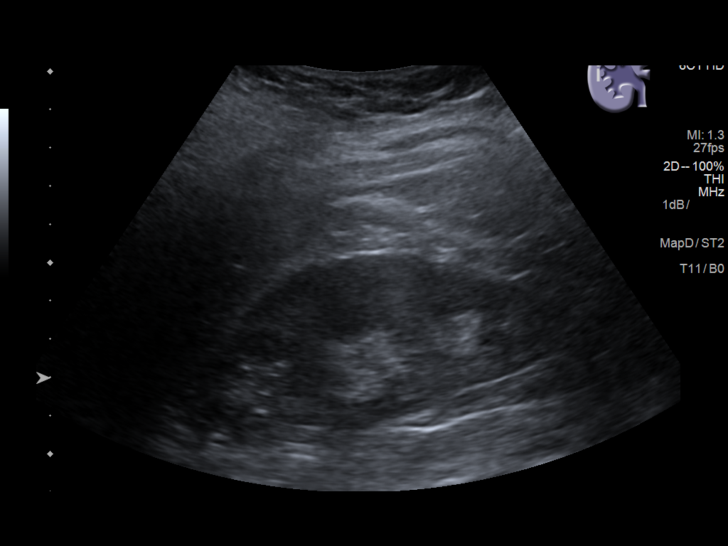
[im 12/27]
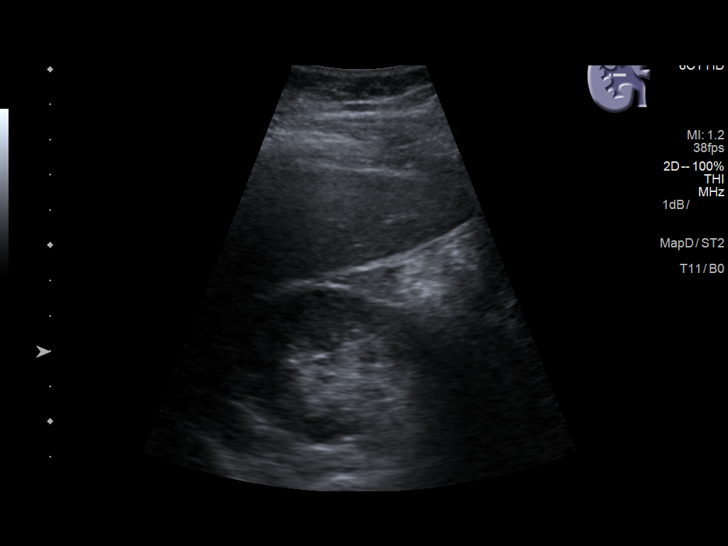
[im 15/27]
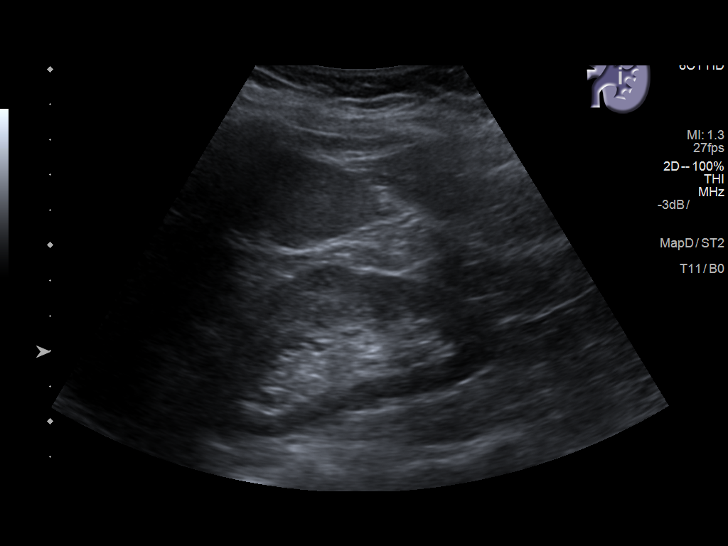
[im 17/27]
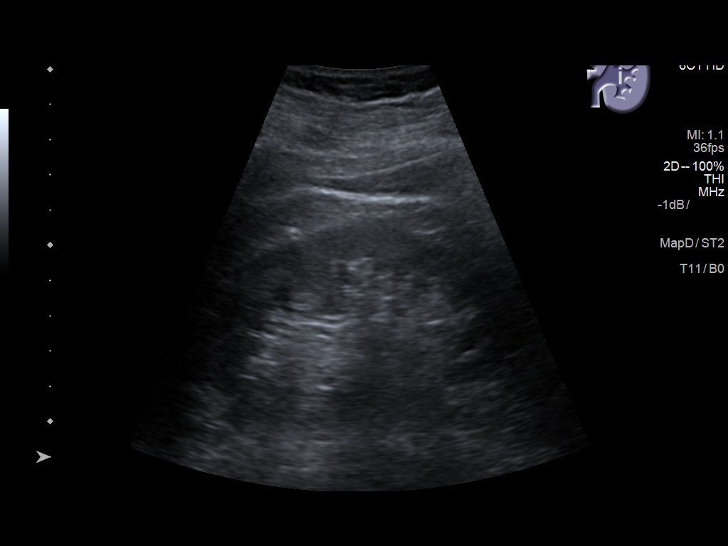
[im 18/27]
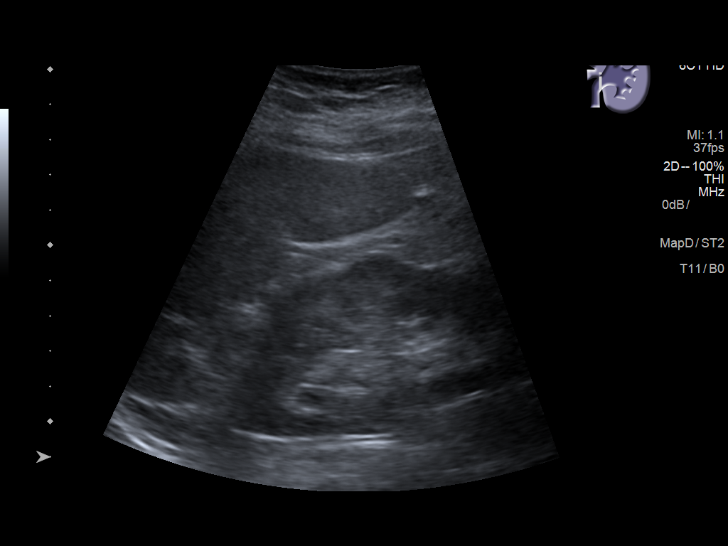
[im 20/27]
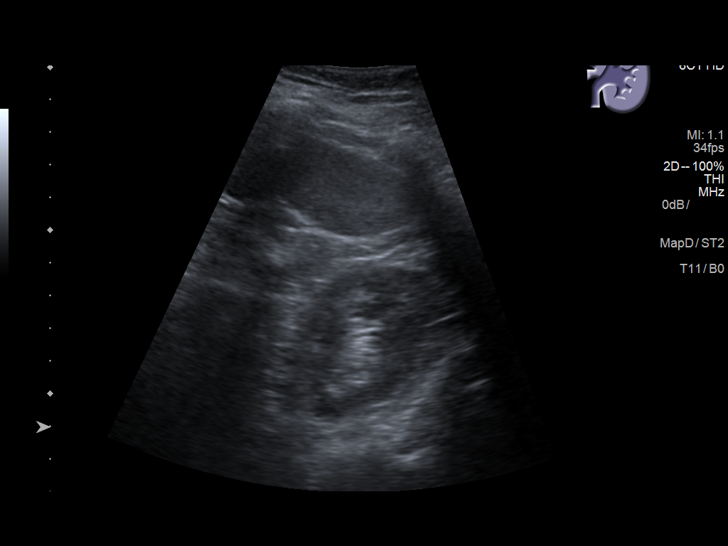
[im 22/27]
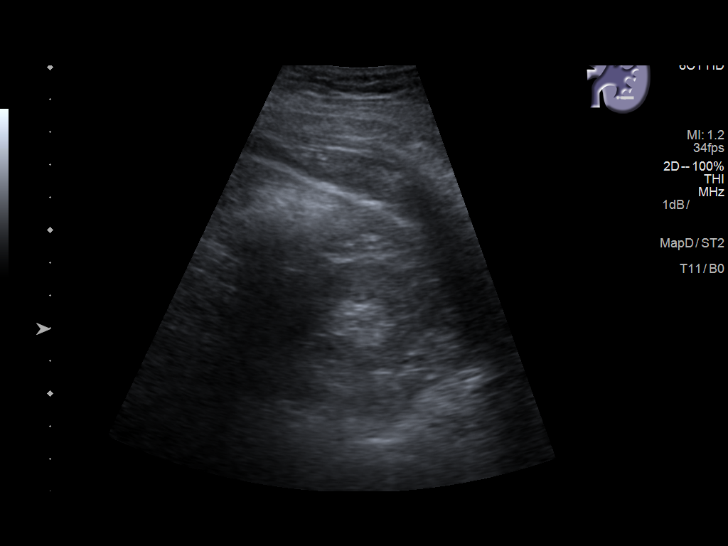
[im 24/27]
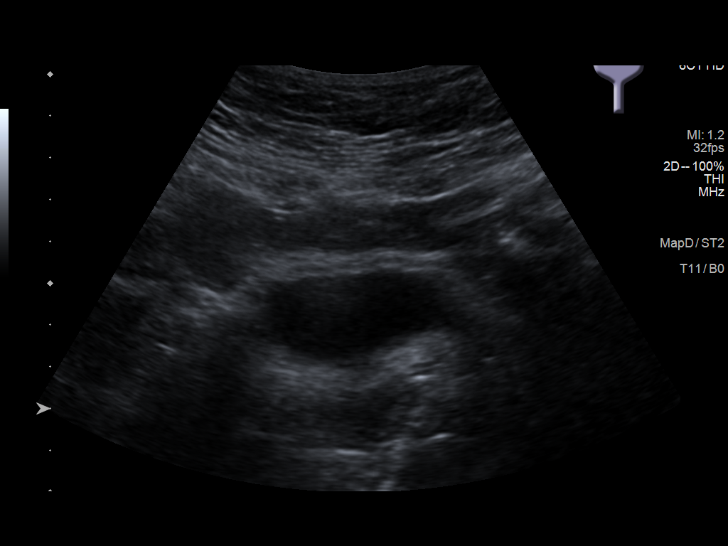
[im 27/27]
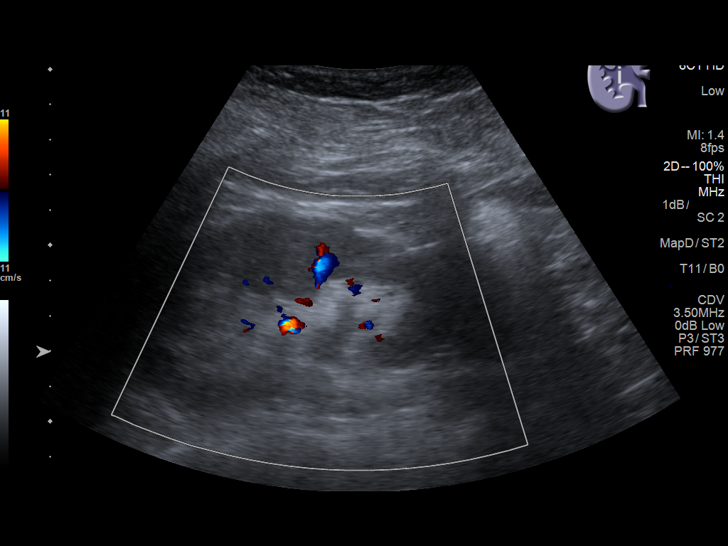

[14 of 25 positions shown; findings below may reference images not displayed]

FINDINGS: Right Kidney:

Length: 9.6 cm, within normal limits.. Echogenicity within normal
limits. Minimal residual hydronephrosis continues to improve. No
shadowing stones are identified.

Left Kidney:

Length: 9.1 cm, within normal limits. Echogenicity within normal
limits. No mass or hydronephrosis visualized.

Bladder:

Appears normal for degree of bladder distention.
IMPRESSION: 1. Continued improvement in now minimal right-sided hydronephrosis.
2. Normal sonographic appearance of the left kidney.

## 2018-11-05 ENCOUNTER — Ambulatory Visit: Payer: BLUE CROSS/BLUE SHIELD | Admitting: Internal Medicine

## 2018-12-03 ENCOUNTER — Ambulatory Visit: Payer: BLUE CROSS/BLUE SHIELD | Admitting: Internal Medicine

## 2019-01-11 ENCOUNTER — Other Ambulatory Visit: Payer: Self-pay | Admitting: Internal Medicine

## 2019-02-06 ENCOUNTER — Ambulatory Visit: Payer: BLUE CROSS/BLUE SHIELD | Admitting: Internal Medicine

## 2019-04-10 ENCOUNTER — Ambulatory Visit: Payer: BLUE CROSS/BLUE SHIELD | Admitting: Internal Medicine

## 2019-04-14 ENCOUNTER — Other Ambulatory Visit: Payer: Self-pay | Admitting: Internal Medicine

## 2019-05-06 ENCOUNTER — Ambulatory Visit: Payer: BLUE CROSS/BLUE SHIELD | Admitting: Internal Medicine

## 2019-06-02 ENCOUNTER — Other Ambulatory Visit: Payer: Self-pay | Admitting: Internal Medicine

## 2019-06-23 ENCOUNTER — Other Ambulatory Visit: Payer: Self-pay | Admitting: Internal Medicine

## 2019-07-09 ENCOUNTER — Other Ambulatory Visit: Payer: Self-pay | Admitting: Internal Medicine

## 2019-08-05 ENCOUNTER — Ambulatory Visit: Payer: BLUE CROSS/BLUE SHIELD | Admitting: Internal Medicine

## 2019-08-25 ENCOUNTER — Telehealth: Payer: Self-pay | Admitting: Internal Medicine

## 2019-08-25 NOTE — Telephone Encounter (Signed)
Received notification that pt is overdue mammogram.  Needs to schedule. It appears she gets at [facility]

## 2019-08-29 NOTE — Telephone Encounter (Signed)
Left detailed message for pt 

## 2019-10-07 ENCOUNTER — Other Ambulatory Visit: Payer: Self-pay | Admitting: Internal Medicine

## 2019-10-13 LAB — HM MAMMOGRAPHY

## 2019-10-29 ENCOUNTER — Other Ambulatory Visit: Payer: Self-pay | Admitting: Diagnostic Radiology

## 2019-10-29 DIAGNOSIS — Z1231 Encounter for screening mammogram for malignant neoplasm of breast: Secondary | ICD-10-CM

## 2019-11-18 ENCOUNTER — Ambulatory Visit: Payer: BLUE CROSS/BLUE SHIELD | Admitting: Internal Medicine

## 2019-12-09 ENCOUNTER — Ambulatory Visit: Payer: BLUE CROSS/BLUE SHIELD

## 2019-12-30 ENCOUNTER — Other Ambulatory Visit: Payer: Self-pay | Admitting: Internal Medicine

## 2019-12-31 ENCOUNTER — Other Ambulatory Visit: Payer: Self-pay | Admitting: Internal Medicine

## 2020-02-19 ENCOUNTER — Ambulatory Visit: Payer: BLUE CROSS/BLUE SHIELD | Admitting: Internal Medicine

## 2020-03-28 ENCOUNTER — Other Ambulatory Visit: Payer: Self-pay | Admitting: Internal Medicine

## 2020-04-29 ENCOUNTER — Ambulatory Visit: Payer: Self-pay | Admitting: Internal Medicine

## 2020-05-20 ENCOUNTER — Other Ambulatory Visit: Payer: Self-pay | Admitting: Internal Medicine

## 2020-06-08 ENCOUNTER — Ambulatory Visit: Payer: Self-pay | Admitting: Internal Medicine

## 2020-06-17 ENCOUNTER — Other Ambulatory Visit: Payer: Self-pay | Admitting: Internal Medicine

## 2020-07-01 ENCOUNTER — Other Ambulatory Visit: Payer: Self-pay | Admitting: Internal Medicine

## 2020-07-20 ENCOUNTER — Other Ambulatory Visit (HOSPITAL_COMMUNITY): Payer: Self-pay

## 2020-07-20 ENCOUNTER — Telehealth: Payer: 59 | Admitting: Emergency Medicine

## 2020-07-20 ENCOUNTER — Telehealth: Payer: 59 | Admitting: Physician Assistant

## 2020-07-20 DIAGNOSIS — Z20822 Contact with and (suspected) exposure to covid-19: Secondary | ICD-10-CM

## 2020-07-20 DIAGNOSIS — U071 COVID-19: Secondary | ICD-10-CM | POA: Diagnosis not present

## 2020-07-20 MED ORDER — BENZONATATE 100 MG PO CAPS: 100.0000 mg | ORAL_CAPSULE | Freq: Three times a day (TID) | ORAL | 0 refills | Status: DC | PRN

## 2020-07-20 MED ORDER — MOLNUPIRAVIR EUA 200MG CAPSULE
4.0000 | ORAL_CAPSULE | Freq: Two times a day (BID) | ORAL | 0 refills | Status: AC
  Filled 2020-07-20: qty 40, 5d supply, fill #0

## 2020-07-20 NOTE — Progress Notes (Signed)
Virtual Visit Consent   Angelica May, you are scheduled for a virtual visit with a [facility] provider today.     Just as with appointments in the office, your consent must be obtained to participate.  Your consent will be active for this visit and any virtual visit you may have with one of our providers in the next 365 days.     If you have a MyChart account, a copy of this consent can be sent to you electronically.  All virtual visits are billed to your insurance company just like a traditional visit in the office.    As this is a virtual visit, video technology does not allow for your provider to perform a traditional examination.  This may limit your provider's ability to fully assess your condition.  If your provider identifies any concerns that need to be evaluated in person or the need to arrange testing (such as labs, EKG, etc.), we will make arrangements to do so.     Although advances in technology are sophisticated, we cannot ensure that it will always work on either your end or our end.  If the connection with a video visit is poor, the visit may have to be switched to a telephone visit.  With either a video or telephone visit, we are not always able to ensure that we have a secure connection.     I need to obtain your verbal consent now.   Are you willing to proceed with your visit today?    Railyn House Adolf has provided verbal consent on 07/20/2020 for a virtual visit telephone.   Roxy Horseman, PA-C   Date: 07/20/2020 1:44 PM   Virtual Visit via Video Note   I, Roxy Horseman, connected with Kalene Cutler ([id number], 1975/01/21) on 07/20/20 at  1:45 PM EDT by a video-enabled telemedicine application and verified that I am speaking with the correct person using two identifiers.  Location: Patient: Virtual Visit Location Patient: Home Provider: Virtual Visit Location Provider: Home Office   I discussed the limitations of evaluation and management by telemedicine and  the availability of in person appointments. The patient expressed understanding and agreed to proceed.    History of Present Illness: Angelica May is a 45 y.o. who identifies as a female who was assigned female at birth, and is being seen today for Covid -19.  Reports body aches, sore throat, headache, cough.  Onset was overnight last night. Has had sick contacts.  Has tried taking nothing for her symptoms.  HPI: HPI  Problems:  Patient Active Problem List   Diagnosis Date Noted   Hoarseness 01/17/2018   Abnormal mammogram 01/17/2018   Healthcare maintenance 05/10/2017   Nephrolithiasis 04/15/2017   Cervical cancer screening 04/15/2017   Itching 04/15/2017   Hypertension, essential 04/12/2017   Amenorrhea 04/12/2017    Allergies:  Allergies  Allergen Reactions   Penicillins Rash    Has patient had a PCN reaction causing immediate rash, facial/tongue/throat swelling, SOB or lightheadedness with hypotension: Yes Has patient had a PCN reaction causing severe rash involving mucus membranes or skin necrosis: No Has patient had a PCN reaction that required hospitalization: No Has patient had a PCN reaction occurring within the last 10 years: No If all of the above answers are "NO", then may proceed with Cephalosporin use.    Medications:  Current Outpatient Medications:    amLODipine (NORVASC) 10 MG tablet, TAKE 1 TABLET BY MOUTH EVERY DAY, Disp: 90 tablet, Rfl: 0  amphetamine-dextroamphetamine (ADDERALL) 30 MG tablet, TK 1 T PO EVERY MORNING AND EVERY DAY AT NOON, Disp: , Rfl: 0   benzonatate (TESSALON) 100 MG capsule, Take 1 capsule (100 mg total) by mouth 3 (three) times daily as needed for cough., Disp: 30 capsule, Rfl: 0   desvenlafaxine (PRISTIQ) 100 MG 24 hr tablet, Take 100 mg by mouth daily., Disp: , Rfl:    LORazepam (ATIVAN) 1 MG tablet, , Disp: , Rfl:    metoprolol succinate (TOPROL-XL) 25 MG 24 hr tablet, TAKE 1/2 TABLET BY MOUTH EVERY DAY, Disp: 45 tablet, Rfl: 3    mupirocin ointment (BACTROBAN) 2 %, Apply to affected area bid., Disp: 22 g, Rfl: 0   norethindrone (MICRONOR) 0.35 MG tablet, TAKE 1 TABLET BY MOUTH EVERY DAY, Disp: 84 tablet, Rfl: 1   traZODone (DESYREL) 50 MG tablet, Take 50 mg by mouth at bedtime., Disp: , Rfl:   Observations/Objective: Patient is well-developed, well-nourished in no acute distress.  Resting comfortably at home.  Head is normocephalic, atraumatic.  No labored breathing.  Speech is clear and coherent with logical content.  Patient is alert and oriented at baseline.    Assessment and Plan: 1. COVID-19 - Molnupiravir -Tessalon -F/u if worsening  Follow Up Instructions: I discussed the assessment and treatment plan with the patient. The patient was provided an opportunity to ask questions and all were answered. The patient agreed with the plan and demonstrated an understanding of the instructions.  A copy of instructions were sent to the patient via MyChart.  The patient was advised to call back or seek an in-person evaluation if the symptoms worsen or if the condition fails to improve as anticipated.  Time:  I spent 15 minutes with the patient via telehealth technology discussing the above problems/concerns.    Roxy Horseman, PA-C

## 2020-07-20 NOTE — Patient Instructions (Signed)
Angelica May, thank you for joining [name], PA-C for today's virtual visit.  While this provider is not your primary care provider (PCP), if your PCP is located in our provider database this encounter information will be shared with them immediately following your visit.  Consent: (Patient) Angelica May provided verbal consent for this virtual visit at the beginning of the encounter.  Current Medications:  Current Outpatient Medications:    amLODipine (NORVASC) 10 MG tablet, TAKE 1 TABLET BY MOUTH EVERY DAY, Disp: 90 tablet, Rfl: 0   amphetamine-dextroamphetamine (ADDERALL) 30 MG tablet, TK 1 T PO EVERY MORNING AND EVERY DAY AT NOON, Disp: , Rfl: 0   desvenlafaxine (PRISTIQ) 100 MG 24 hr tablet, Take 100 mg by mouth daily., Disp: , Rfl:    metoprolol succinate (TOPROL-XL) 25 MG 24 hr tablet, TAKE 1/2 TABLET BY MOUTH EVERY DAY, Disp: 45 tablet, Rfl: 3   mupirocin ointment (BACTROBAN) 2 %, Apply to affected area bid., Disp: 22 g, Rfl: 0   norethindrone (MICRONOR) 0.35 MG tablet, TAKE 1 TABLET BY MOUTH EVERY DAY, Disp: 84 tablet, Rfl: 1   traZODone (DESYREL) 50 MG tablet, Take 50 mg by mouth at bedtime., Disp: , Rfl:    Medications ordered in this encounter:  No orders of the defined types were placed in this encounter.    *If you need refills on other medications prior to your next appointment, please contact your pharmacy*  Follow-Up: Call back or seek an in-person evaluation if the symptoms worsen or if the condition fails to improve as anticipated.  Other Instructions  Your current symptoms could be consistent with the coronavirus.  Many health care providers can now test patients at their office but not all are.  [facility] has multiple testing sites. For information on our COVID testing locations and hours go to [email or web address]  We are enrolling you in our MyChart Home Monitoring for COVID19 . Daily you will receive a questionnaire within the MyChart  website. Our COVID 19 response team will be monitoring your responses daily.  Testing Information: The COVID-19 Community Testing sites are testing BY APPOINTMENT ONLY.  You can schedule online at [email or web address]  If you do not have access to a smart phone or computer you may call [phone] for an appointment.   Additional testing sites in the Community:  For [facility] Testing sites in [place]  [email or web address]  For Pop-up testing sites in [place]  [email or web address]  For [facility] [email or web address]  For [facility] testing in [place] and [employer] [email or web address]  For [facility] testing in [place]   [email or web address]  For  more information about community testing call [phone]   Please quarantine yourself while awaiting your test results. Please stay home for a minimum of 10 days from the first day of illness with improving symptoms and you have had 24 hours of no fever (without the use of Tylenol (Acetaminophen) Motrin (Ibuprofen) or any fever reducing medication).  Also - Do not get tested prior to returning to work because once you have had a positive test the test can stay positive for more than a month in some cases.   You should wear a mask or cloth face covering over your nose and mouth if you must be around other people or animals, including pets (even at home). Try to stay at least 6 feet away from other people. This will protect the people around you.  Please continue good preventive care measures, including:  frequent hand-washing, avoid touching your face, cover coughs/sneezes, stay out of crowds and keep a  6 foot distance from others.  COVID-19 is a respiratory illness with symptoms that are similar to the flu. Symptoms are typically mild to moderate, but there have been cases of severe illness and death due to the virus.   The following symptoms may appear 2-14 days after exposure: Fever Cough Shortness of breath or difficulty breathing Chills Repeated shaking with chills Muscle pain Headache Sore throat New loss of taste or smell Fatigue Congestion or runny nose Nausea or vomiting Diarrhea  Go to the nearest hospital ED for assessment if fever/cough/breathlessness are severe or illness seems like a threat to life.  It is vitally important that if you feel that you have an infection such as this virus or any other virus that you stay home and away from places where you may spread it to others.  You should avoid contact with people age 66 and older.   You can use medication such as prescription cough medication called Tessalon Perles 100 mg. You may take 1-2 capsules every 8 hours as needed for cough  You may also take acetaminophen (Tylenol) as needed for fever.  Reduce your risk of any infection by using the same precautions used for avoiding the common cold or flu:  Wash your hands often with soap and warm water for at least 20 seconds.  If soap and water are not readily available, use an alcohol-based hand sanitizer with at least 60% alcohol.  If coughing or sneezing, cover your mouth and nose by coughing or sneezing into the elbow areas of your shirt or coat, into a tissue or into your sleeve (not your hands). Avoid shaking hands with others and consider head nods or verbal greetings only. Avoid touching your eyes, nose, or mouth with unwashed hands.  Avoid close contact with people who are sick. Avoid places or events with large numbers of people in one location, like concerts or sporting events. Carefully consider travel plans you have or are making. If you are planning any travel  outside or inside the [place], visit the CDC's Travelers' Health webpage for the latest health notices. If you have some symptoms but not all symptoms, continue to monitor at home and seek medical attention if your symptoms worsen. If you are having a medical emergency, call 911.  HOME CARE Only take medications as instructed by your medical team. Drink plenty of fluids and get plenty of rest. A steam or ultrasonic humidifier can help if you have congestion.   GET HELP RIGHT AWAY IF YOU HAVE EMERGENCY WARNING SIGNS** FOR COVID-19. If you or someone is showing any of these signs seek emergency medical care immediately. Call 911 or proceed to your closest emergency facility if: You develop worsening high fever. Trouble breathing Bluish lips or face Persistent pain or pressure in the chest New confusion Inability to wake or stay awake You cough up blood. Your symptoms become more severe  **This list is not all possible symptoms. Contact your medical provider for any symptoms that are sever or concerning to you.  MAKE SURE YOU  Understand these instructions. Will watch your condition. Will get help right away if you are not doing well or get worse.     If you have been instructed to have an in-person evaluation today at a local Urgent Care facility, please use the link below. It will take you to a list of all of our available [facility]  Urgent Cares, including address, phone number and hours of operation. Please do not delay care.  [facility]  If you or a family member do not have a primary care provider, use the link below to schedule a visit and establish care. When you choose a [facility] primary care physician or advanced practice provider, you gain a long-term partner in health. Find a Primary Care Provider  Learn more about [facility]'s in-office and virtual care options: [facility]

## 2020-07-20 NOTE — Progress Notes (Signed)
Virtual Visit Consent   Angelica May, you are scheduled for a virtual visit with a [facility] provider today.     Just as with appointments in the office, your consent must be obtained to participate.  Your consent will be active for this visit and any virtual visit you may have with one of our providers in the next 365 days.     If you have a MyChart account, a copy of this consent can be sent to you electronically.  All virtual visits are billed to your insurance company just like a traditional visit in the office.    As this is a virtual visit, video technology does not allow for your provider to perform a traditional examination.  This may limit your provider's ability to fully assess your condition.  If your provider identifies any concerns that need to be evaluated in person or the need to arrange testing (such as labs, EKG, etc.), we will make arrangements to do so.     Although advances in technology are sophisticated, we cannot ensure that it will always work on either your end or our end.  If the connection with a video visit is poor, the visit may have to be switched to a telephone visit.  With either a video or telephone visit, we are not always able to ensure that we have a secure connection.     I need to obtain your verbal consent now.   Are you willing to proceed with your visit today?    Angelica May has provided verbal consent on 07/20/2020 for a virtual visit (video or telephone).   [name], [place]   Date: 07/20/2020 10:26 AM   Virtual Visit via Video Note   I, [name], connected with  Angelica May  ([id number], July 23, 1975) on 07/20/20 at 10:15 AM EDT by a video-enabled telemedicine application and verified that I am speaking with the correct person using two identifiers.  Location: Patient: Virtual Visit Location Patient: Other: work Provider: Pharmacist, community: Home Office   I discussed the limitations of evaluation and  management by telemedicine and the availability of in person appointments. The patient expressed understanding and agreed to proceed.    History of Present Illness: Angelica May is a 45 y.o. who identifies as a female who was assigned female at birth, and is being seen today for 1 days of dry cough, nasal congestion, chest congestion, subjective fevers, headache, aches. Denies sore throat, sinus pain, ear pain or tooth pain. Notes sinus pressure. Denies chest pain or SOB. Denies recent travel or known sick contact. Has taken some OTC Zarbees for cough. Has had her COVID vaccines.   HPI: HPI  Problems:  Patient Active Problem List   Diagnosis Date Noted   Hoarseness 01/17/2018   Abnormal mammogram 01/17/2018   Healthcare maintenance 05/10/2017   Nephrolithiasis 04/15/2017   Cervical cancer screening 04/15/2017   Itching 04/15/2017   Hypertension, essential 04/12/2017   Amenorrhea 04/12/2017    Allergies:  Allergies  Allergen Reactions   Penicillins Rash    Has patient had a PCN reaction causing immediate rash, facial/tongue/throat swelling, SOB or lightheadedness with hypotension: Yes Has patient had a PCN reaction causing severe rash involving mucus membranes or skin necrosis: No Has patient had a PCN reaction that required hospitalization: No Has patient had a PCN reaction occurring within the last 10 years: No If all of the above answers are "NO", then may proceed with Cephalosporin  use.    Medications:  Current Outpatient Medications:    benzonatate (TESSALON) 100 MG capsule, Take 1 capsule (100 mg total) by mouth 3 (three) times daily as needed for cough., Disp: 30 capsule, Rfl: 0   LORazepam (ATIVAN) 1 MG tablet, , Disp: , Rfl:    amLODipine (NORVASC) 10 MG tablet, TAKE 1 TABLET BY MOUTH EVERY DAY, Disp: 90 tablet, Rfl: 0   amphetamine-dextroamphetamine (ADDERALL) 30 MG tablet, TK 1 T PO EVERY MORNING AND EVERY DAY AT NOON, Disp: , Rfl: 0   desvenlafaxine (PRISTIQ) 100 MG  24 hr tablet, Take 100 mg by mouth daily., Disp: , Rfl:    metoprolol succinate (TOPROL-XL) 25 MG 24 hr tablet, TAKE 1/2 TABLET BY MOUTH EVERY DAY, Disp: 45 tablet, Rfl: 3   mupirocin ointment (BACTROBAN) 2 %, Apply to affected area bid., Disp: 22 g, Rfl: 0   norethindrone (MICRONOR) 0.35 MG tablet, TAKE 1 TABLET BY MOUTH EVERY DAY, Disp: 84 tablet, Rfl: 1   traZODone (DESYREL) 50 MG tablet, Take 50 mg by mouth at bedtime., Disp: , Rfl:   Observations/Objective: Patient is well-developed, well-nourished in no acute distress.  Resting comfortably at home.  Head is normocephalic, atraumatic.  No labored breathing. Speech is clear and coherent with logical content.  Patient is alert and oriented at baseline.   Assessment and Plan: 1. Suspected COVID-19 virus infection - benzonatate (TESSALON) 100 MG capsule; Take 1 capsule (100 mg total) by mouth 3 (three) times daily as needed for cough.  Dispense: 30 capsule; Refill: 0 Patient to leave work immediately and quarantine until able to get tested and results are in. Resources for testing given to patient. Supportive measures, OTC medications and vitamin regimen discussed. Tessalon per orders. She is to submit message to [place] or another video once results are in to discuss next steps.   Follow Up Instructions: I discussed the assessment and treatment plan with the patient. The patient was provided an opportunity to ask questions and all were answered. The patient agreed with the plan and demonstrated an understanding of the instructions.  A copy of instructions were sent to the patient via MyChart.  The patient was advised to call back or seek an in-person evaluation if the symptoms worsen or if the condition fails to improve as anticipated.  Time:  I spent 10 minutes with the patient via telehealth technology discussing the above problems/concerns.    [name], PA-C

## 2020-08-12 ENCOUNTER — Ambulatory Visit: Payer: Self-pay | Admitting: Internal Medicine

## 2020-08-17 ENCOUNTER — Other Ambulatory Visit: Payer: Self-pay | Admitting: Internal Medicine

## 2020-08-31 ENCOUNTER — Ambulatory Visit: Payer: Self-pay | Admitting: Internal Medicine

## 2020-11-05 ENCOUNTER — Other Ambulatory Visit: Payer: Self-pay | Admitting: Internal Medicine

## 2020-11-16 ENCOUNTER — Ambulatory Visit: Payer: 59 | Admitting: Internal Medicine

## 2020-12-01 ENCOUNTER — Other Ambulatory Visit: Payer: Self-pay | Admitting: Internal Medicine

## 2021-01-27 ENCOUNTER — Other Ambulatory Visit (HOSPITAL_COMMUNITY): Payer: Self-pay

## 2021-01-27 MED ORDER — AMPHETAMINE-DEXTROAMPHETAMINE 30 MG PO TABS
30.0000 mg | ORAL_TABLET | Freq: Two times a day (BID) | ORAL | 0 refills | Status: DC
  Filled 2021-01-27: qty 60, 30d supply, fill #0

## 2021-02-15 ENCOUNTER — Ambulatory Visit: Payer: Self-pay | Admitting: Internal Medicine

## 2021-02-15 ENCOUNTER — Other Ambulatory Visit: Payer: Self-pay | Admitting: Internal Medicine

## 2021-02-28 ENCOUNTER — Other Ambulatory Visit (HOSPITAL_COMMUNITY): Payer: Self-pay

## 2021-02-28 MED ORDER — AMPHETAMINE-DEXTROAMPHETAMINE 30 MG PO TABS
30.0000 mg | ORAL_TABLET | Freq: Two times a day (BID) | ORAL | 0 refills | Status: DC
Start: 2021-02-28 — End: 2021-04-01
  Filled 2021-02-28: qty 60, 30d supply, fill #0

## 2021-03-01 ENCOUNTER — Other Ambulatory Visit (HOSPITAL_COMMUNITY): Payer: Self-pay

## 2021-03-01 ENCOUNTER — Other Ambulatory Visit: Payer: Self-pay | Admitting: Internal Medicine

## 2021-03-02 ENCOUNTER — Other Ambulatory Visit (HOSPITAL_COMMUNITY): Payer: Self-pay

## 2021-03-02 MED ORDER — MUPIROCIN 2 % EX OINT
1.0000 "application " | TOPICAL_OINTMENT | Freq: Two times a day (BID) | CUTANEOUS | 0 refills | Status: DC
  Filled 2021-03-02 – 2021-03-25 (×2): qty 22, 11d supply, fill #0

## 2021-03-10 ENCOUNTER — Other Ambulatory Visit (HOSPITAL_COMMUNITY): Payer: Self-pay

## 2021-03-25 ENCOUNTER — Other Ambulatory Visit (HOSPITAL_COMMUNITY): Payer: Self-pay

## 2021-04-01 ENCOUNTER — Other Ambulatory Visit (HOSPITAL_COMMUNITY): Payer: Self-pay

## 2021-04-01 MED ORDER — AMPHETAMINE-DEXTROAMPHETAMINE 30 MG PO TABS
30.0000 mg | ORAL_TABLET | Freq: Two times a day (BID) | ORAL | 0 refills | Status: DC
  Filled 2021-04-01: qty 60, 30d supply, fill #0

## 2021-04-20 ENCOUNTER — Other Ambulatory Visit: Payer: Self-pay | Admitting: Internal Medicine

## 2021-04-26 ENCOUNTER — Other Ambulatory Visit (HOSPITAL_COMMUNITY)
Admission: RE | Admit: 2021-04-26 | Discharge: 2021-04-26 | Disposition: A | Discharge status: Home / Self Care | Payer: 59 | Source: Ambulatory Visit | Attending: Internal Medicine | Admitting: Internal Medicine

## 2021-04-26 ENCOUNTER — Ambulatory Visit (INDEPENDENT_AMBULATORY_CARE_PROVIDER_SITE_OTHER): Payer: 59 | Admitting: Internal Medicine

## 2021-04-26 ENCOUNTER — Encounter: Payer: Self-pay | Admitting: Internal Medicine

## 2021-04-26 VITALS — BP 124/87 | HR 72 | Temp 97.6°F | Resp 14 | Ht 60.0 in | Wt 155.8 lb

## 2021-04-26 DIAGNOSIS — I10 Essential (primary) hypertension: Secondary | ICD-10-CM | POA: Diagnosis not present

## 2021-04-26 DIAGNOSIS — N200 Calculus of kidney: Secondary | ICD-10-CM | POA: Diagnosis not present

## 2021-04-26 DIAGNOSIS — N760 Acute vaginitis: Secondary | ICD-10-CM | POA: Diagnosis not present

## 2021-04-26 DIAGNOSIS — Z13220 Encounter for screening for lipoid disorders: Secondary | ICD-10-CM

## 2021-04-26 DIAGNOSIS — Z124 Encounter for screening for malignant neoplasm of cervix: Secondary | ICD-10-CM

## 2021-04-26 DIAGNOSIS — Z1329 Encounter for screening for other suspected endocrine disorder: Secondary | ICD-10-CM | POA: Diagnosis not present

## 2021-04-26 DIAGNOSIS — R928 Other abnormal and inconclusive findings on diagnostic imaging of breast: Secondary | ICD-10-CM

## 2021-04-26 LAB — CBC WITH DIFFERENTIAL/PLATELET
Basophils Absolute: 0 10*3/uL (ref 0.0–0.1)
Basophils Relative: 0.4 % (ref 0.0–3.0)
Eosinophils Absolute: 0.2 10*3/uL (ref 0.0–0.7)
Eosinophils Relative: 2.8 % (ref 0.0–5.0)
HCT: 39.7 % (ref 36.0–46.0)
Hemoglobin: 13.3 g/dL (ref 12.0–15.0)
Lymphocytes Relative: 26.6 % (ref 12.0–46.0)
Lymphs Abs: 2.1 10*3/uL (ref 0.7–4.0)
MCHC: 33.6 g/dL (ref 30.0–36.0)
MCV: 88.8 fl (ref 78.0–100.0)
Monocytes Absolute: 0.6 10*3/uL (ref 0.1–1.0)
Monocytes Relative: 7.3 % (ref 3.0–12.0)
Neutro Abs: 4.9 10*3/uL (ref 1.4–7.7)
Neutrophils Relative %: 62.9 % (ref 43.0–77.0)
Platelets: 346 10*3/uL (ref 150.0–400.0)
RBC: 4.47 Mil/uL (ref 3.87–5.11)
RDW: 13.4 % (ref 11.5–15.5)
WBC: 7.8 10*3/uL (ref 4.0–10.5)

## 2021-04-26 LAB — LIPID PANEL
Cholesterol: 198 mg/dL (ref 0–200)
HDL: 66.7 mg/dL (ref >39.00)
LDL Cholesterol: 114 mg/dL — ABNORMAL HIGH (ref 0–99)
NonHDL: 131.35
Total CHOL/HDL Ratio: 3
Triglycerides: 87 mg/dL (ref 0.0–149.0)
VLDL: 17.4 mg/dL (ref 0.0–40.0)

## 2021-04-26 LAB — COMPREHENSIVE METABOLIC PANEL
ALT: 12 U/L (ref 0–35)
AST: 13 U/L (ref 0–37)
Albumin: 4.3 g/dL (ref 3.5–5.2)
Alkaline Phosphatase: 76 U/L (ref 39–117)
BUN: 9 mg/dL (ref 6–23)
CO2: 28 mEq/L (ref 19–32)
Calcium: 9 mg/dL (ref 8.4–10.5)
Chloride: 101 mEq/L (ref 96–112)
Creatinine, Ser: 0.92 mg/dL (ref 0.40–1.20)
GFR: 74.49 mL/min (ref >60.00)
Glucose, Bld: 86 mg/dL (ref 70–99)
Potassium: 4.1 mEq/L (ref 3.5–5.1)
Sodium: 137 mEq/L (ref 135–145)
Total Bilirubin: 0.5 mg/dL (ref 0.2–1.2)
Total Protein: 7 g/dL (ref 6.0–8.3)

## 2021-04-26 LAB — TSH: TSH: 2.94 u[IU]/mL (ref 0.35–5.50)

## 2021-04-26 NOTE — Progress Notes (Signed)
Patient ID: Angelica May, female   DOB: May 20, 1975, 46 y.o.   MRN: [id number] ? ? ?Subjective:  ? ? Patient ID: Angelica May, female    DOB: [birth year], 46 y.o.   MRN: [id number] ? ?Patient here for a scheduled follow up.   ? ?Chief Complaint  ?Patient presents with  ? Follow-up  ?  F/u for HTN   ? .  ? ?HPI ?Overdue f/u appt.  States she is doing well. Feels good.  Tries to stay active.  No chest pain or sob reported.  No abdominal pain or bowel change reported.  No cough or congestion.  Was questioning G. Vag. Wants to be checked.   ? ? ?Past Medical History:  ?Diagnosis Date  ? Anxiety   ? Depression   ? Hyperlipemia   ? Hypertension   ? Nephrolithiasis   ? ?Past Surgical History:  ?Procedure Laterality Date  ? EXTRACORPOREAL SHOCK WAVE LITHOTRIPSY Right 06/15/2016  ? Procedure: EXTRACORPOREAL SHOCK WAVE LITHOTRIPSY (ESWL);  Surgeon: Hollice Espy, MD;  Location: [facility] ORS;  Service: Urology;  Laterality: Right;  ? LITHOTRIPSY  2007  ? LITHOTRIPSY  06/15/2016  ? WISDOM TOOTH EXTRACTION    ? ?Family History  ?Problem Relation Age of Onset  ? Breast cancer Maternal Grandmother   ? Breast cancer Cousin   ? Prostate cancer Paternal Uncle   ? Asthma Mother   ? Depression Mother   ? Diabetes Mother   ? Hypertension Mother   ? Depression Father   ? Hyperlipidemia Father   ? Hypertension Father   ? Stroke Father   ? Alcohol abuse Brother   ? Depression Brother   ? Hypertension Brother   ? Hyperlipidemia Brother   ? Hypertension Brother   ? Hyperlipidemia Brother   ? Hyperlipidemia Paternal Grandmother   ? Hypertension Paternal Grandmother   ? Kidney disease Paternal Grandmother   ? Stroke Paternal Grandmother   ? Kidney cancer Neg Hx   ? ?Social History  ? ?Socioeconomic History  ? Marital status: Single  ?  Spouse name: Not on file  ? Number of children: Not on file  ? Years of education: Not on file  ? Highest education level: Not on file  ?Occupational History  ? Not on file  ?Tobacco Use  ? Smoking status: Never   ? Smokeless tobacco: Never  ?Substance and Sexual Activity  ? Alcohol use: Yes  ?  Comment: occ  ? Drug use: No  ? Sexual activity: Yes  ?Other Topics Concern  ? Not on file  ?Social History Narrative  ? Not on file  ? ?Social Determinants of Health  ? ?Financial Resource Strain: Not on file  ?Food Insecurity: Not on file  ?Transportation Needs: Not on file  ?Physical Activity: Not on file  ?Stress: Not on file  ?Social Connections: Not on file  ? ? ? ?Review of Systems  ?Constitutional:  Negative for appetite change and unexpected weight change.  ?HENT:  Negative for congestion, sinus pressure and sore throat.   ?Eyes:  Negative for pain and visual disturbance.  ?Respiratory:  Negative for cough, chest tightness and shortness of breath.   ?Cardiovascular:  Negative for chest pain, palpitations and leg swelling.  ?Gastrointestinal:  Negative for abdominal pain, diarrhea, nausea and vomiting.  ?Genitourinary:  Negative for difficulty urinating and dysuria.  ?Musculoskeletal:  Negative for joint swelling and myalgias.  ?Skin:  Negative for color change and rash.  ?Neurological:  Negative for dizziness, light-headedness and headaches.  ?  Hematological:  Negative for adenopathy. Does not bruise/bleed easily.  ?Psychiatric/Behavioral:  Negative for agitation and dysphoric mood.   ? ?   ?Objective:  ?  ? ?BP 124/87 (BP Location: Left Arm, Patient Position: Sitting, Cuff Size: Small)   Pulse 72   Temp 97.6 ?F (36.4 ?C) (Temporal)   Resp 14   Ht 5' (1.524 m)   Wt 155 lb 12.8 oz (70.7 kg)   SpO2 99%   BMI 30.43 kg/m?  ?Wt Readings from Last 3 Encounters:  ?04/26/21 155 lb 12.8 oz (70.7 kg)  ?03/05/18 161 lb 9.6 oz (73.3 kg)  ?01/17/18 159 lb 12.8 oz (72.5 kg)  ? ? ?Physical Exam ?Vitals reviewed.  ?Constitutional:   ?   General: She is not in acute distress. ?   Appearance: Normal appearance. She is well-developed.  ?HENT:  ?   Head: Normocephalic and atraumatic.  ?   Right Ear: External ear normal.  ?   Left Ear:  External ear normal.  ?Eyes:  ?   General: No scleral icterus.    ?   Right eye: No discharge.     ?   Left eye: No discharge.  ?   Conjunctiva/sclera: Conjunctivae normal.  ?Neck:  ?   Thyroid: No thyromegaly.  ?Cardiovascular:  ?   Rate and Rhythm: Normal rate and regular rhythm.  ?Pulmonary:  ?   Effort: No tachypnea, accessory muscle usage or respiratory distress.  ?   Breath sounds: Normal breath sounds. No decreased breath sounds or wheezing.  ?Chest:  ?Breasts: ?   Right: No inverted nipple, mass, nipple discharge or tenderness (no axillary adenopathy).  ?   Left: No inverted nipple, mass, nipple discharge or tenderness (no axilarry adenopathy).  ?Abdominal:  ?   General: Bowel sounds are normal.  ?   Palpations: Abdomen is soft.  ?   Tenderness: There is no abdominal tenderness.  ?Genitourinary: ?   Comments: Normal external genitalia.  Vaginal vault without lesions.  Cervix identified.  Pap smear performed.  Could not appreciate any adnexal masses or tenderness.  KOH/wet prep sent.   ?Musculoskeletal:     ?   General: No swelling or tenderness.  ?   Cervical back: Neck supple.  ?Lymphadenopathy:  ?   Cervical: No cervical adenopathy.  ?Skin: ?   Findings: No erythema or rash.  ?Neurological:  ?   Mental Status: She is alert and oriented to person, place, and time.  ?Psychiatric:     ?   Mood and Affect: Mood normal.     ?   Behavior: Behavior normal.  ? ? ? ?Outpatient Encounter Medications as of 04/26/2021  ?Medication Sig  ? amphetamine-dextroamphetamine (ADDERALL) 30 MG tablet TK 1 T PO EVERY MORNING AND EVERY DAY AT NOON  ? desvenlafaxine (PRISTIQ) 100 MG 24 hr tablet Take 100 mg by mouth daily.  ? LORazepam (ATIVAN) 1 MG tablet   ? mupirocin ointment (BACTROBAN) 2 % Apply 1 application topically 2 (two) times daily to affected area  ? norethindrone (MICRONOR) 0.35 MG tablet TAKE ONE TABLET BY MOUTH ONE TIME DAILY  ? traZODone (DESYREL) 50 MG tablet Take 50 mg by mouth at bedtime.  ? [DISCONTINUED]  amLODipine (NORVASC) 10 MG tablet TAKE ONE TABLET BY MOUTH ONE TIME DAILY  ? [DISCONTINUED] amphetamine-dextroamphetamine (ADDERALL) 30 MG tablet Take 1 tablet by mouth 2 (two) times daily.  ? [DISCONTINUED] metoprolol succinate (TOPROL-XL) 25 MG 24 hr tablet TAKE 1/2 TABLET BY MOUTH EVERY DAY  ? [  DISCONTINUED] benzonatate (TESSALON) 100 MG capsule Take 1 capsule (100 mg total) by mouth 3 (three) times daily as needed for cough. (Patient not taking: Reported on 04/26/2021)  ? ?No facility-administered encounter medications on file as of 04/26/2021.  ?  ? ?Lab Results  ?Component Value Date  ? WBC 7.8 04/26/2021  ? HGB 13.3 04/26/2021  ? HCT 39.7 04/26/2021  ? PLT 346.0 04/26/2021  ? GLUCOSE 86 04/26/2021  ? CHOL 198 04/26/2021  ? TRIG 87.0 04/26/2021  ? HDL 66.70 04/26/2021  ? LDLCALC 114 (H) 04/26/2021  ? ALT 12 04/26/2021  ? AST 13 04/26/2021  ? NA 137 04/26/2021  ? K 4.1 04/26/2021  ? CL 101 04/26/2021  ? CREATININE 0.92 04/26/2021  ? BUN 9 04/26/2021  ? CO2 28 04/26/2021  ? TSH 2.94 04/26/2021  ? ? ?US BREAST LTD UNI RIGHT INC AXILLA ? ?Result Date: 05/10/2017 ?CLINICAL DATA:  46 year old patient recalled from recent screening mammogram for evaluation a possible asymmetry in the superior right breast. EXAM: DIGITAL DIAGNOSTIC RIGHT MAMMOGRAM WITH CAD AND TOMO ULTRASOUND RIGHT BREAST COMPARISON:  May 03, 2017 and earlier priors ACR Breast Density Category c: The breast tissue is heterogeneously dense, which may obscure small masses. FINDINGS: Focal spot compression view of the superior right breast and 90 degree lateral view of the right breast shows heterogeneously dense breast tissue without a definite mass. Mammographic images were processed with CAD. Targeted ultrasound is performed, showing heterogeneously dense breast parenchyma without a solid or cystic mass or abnormal shadowing to suggest malignancy. IMPRESSION: No evidence of malignancy in the right breast. RECOMMENDATION: Screening mammogram in one  year.(Code:[id number]-01Y) I have discussed the findings and recommendations with the patient. Results were also provided in writing at the conclusion of the visit. If applicable, a reminder letter will be sent to the patient reg

## 2021-04-27 LAB — CERVICOVAGINAL ANCILLARY ONLY
Bacterial Vaginitis (gardnerella): POSITIVE — AB
Candida Glabrata: NEGATIVE
Candida Vaginitis: NEGATIVE
Comment: NEGATIVE
Comment: NEGATIVE
Comment: NEGATIVE

## 2021-04-28 ENCOUNTER — Telehealth: Payer: Self-pay

## 2021-04-28 LAB — CYTOLOGY - PAP
Comment: NEGATIVE
Diagnosis: NEGATIVE
High risk HPV: NEGATIVE

## 2021-04-28 NOTE — Telephone Encounter (Signed)
See result note.  

## 2021-04-28 NOTE — Telephone Encounter (Signed)
Pt returning call... Pt stated that if provider is going to prescribe something to send it to [facility].Marland Kitchen ?

## 2021-04-28 NOTE — Telephone Encounter (Signed)
LMTCB for lab results.  

## 2021-04-29 ENCOUNTER — Other Ambulatory Visit: Payer: Self-pay | Admitting: Internal Medicine

## 2021-04-29 MED ORDER — METRONIDAZOLE 500 MG PO TABS: 500.0000 mg | ORAL_TABLET | Freq: Two times a day (BID) | ORAL | 0 refills | Status: AC

## 2021-04-29 NOTE — Telephone Encounter (Signed)
Pt called and made aware that Flagyl sent to [facility].  ?

## 2021-04-29 NOTE — Telephone Encounter (Signed)
Pt returning call. Pt requesting callback.  °

## 2021-04-29 NOTE — Progress Notes (Signed)
Rx sent in for flagyl 500mg  bid #14 with no refills.  Sent to [facility].  ?

## 2021-04-29 NOTE — Telephone Encounter (Signed)
This may be a duplicate message, but I do not see where the other message went through.  Please notify Angelica May that I sent in a prescription for oral metronidazole (flagyl) - she is to take one tablet bid.  Avoid alcohol while taking.  Let me know if persistent problems.  Rx sent to [facility] in [place].   ?

## 2021-04-29 NOTE — Telephone Encounter (Signed)
LMTCB

## 2021-05-05 ENCOUNTER — Other Ambulatory Visit (HOSPITAL_COMMUNITY): Payer: Self-pay

## 2021-05-05 MED ORDER — AMPHETAMINE-DEXTROAMPHETAMINE 30 MG PO TABS
30.0000 mg | ORAL_TABLET | Freq: Two times a day (BID) | ORAL | 0 refills | Status: DC
  Filled 2021-05-05: qty 40, 20d supply, fill #0
  Filled 2021-05-06: qty 20, 10d supply, fill #0

## 2021-05-06 ENCOUNTER — Other Ambulatory Visit: Payer: Self-pay | Admitting: Internal Medicine

## 2021-05-06 ENCOUNTER — Other Ambulatory Visit (HOSPITAL_COMMUNITY): Payer: Self-pay

## 2021-05-07 ENCOUNTER — Telehealth: Payer: Self-pay | Admitting: Internal Medicine

## 2021-05-07 ENCOUNTER — Encounter: Payer: Self-pay | Admitting: Internal Medicine

## 2021-05-07 DIAGNOSIS — N760 Acute vaginitis: Secondary | ICD-10-CM | POA: Insufficient documentation

## 2021-05-07 NOTE — Assessment & Plan Note (Signed)
KOH/wet prep obtained.   

## 2021-05-07 NOTE — Telephone Encounter (Signed)
Mammogram has been ordered.  She is overdue mammogram.  Has at [facility].  Need to make sure scheduled.   ?

## 2021-05-07 NOTE — Assessment & Plan Note (Signed)
PAP smear today.  

## 2021-05-07 NOTE — Assessment & Plan Note (Signed)
Has a history of kidney stones.  S/p lithotripsy.  Currently doing well.  Follow.  ?

## 2021-05-07 NOTE — Assessment & Plan Note (Signed)
Blood pressure as outlined.  Doing well.  Continue toprol.  Follow pressures.  Follow metabolic panel.  ?

## 2021-05-07 NOTE — Assessment & Plan Note (Signed)
Had mammogram 10/2019 - recommended f/u views. F/u mammogram 12/10/19 - [facility] I.  Due f/u mammogram.   ?

## 2021-05-11 ENCOUNTER — Other Ambulatory Visit: Payer: Self-pay | Admitting: Internal Medicine

## 2021-05-17 NOTE — Telephone Encounter (Signed)
FYI ? ?Pt sched at [facility] main campus Breast imaging  ?5/23 at 9:30am ? ?Lm to advise pt ?

## 2021-05-17 NOTE — Telephone Encounter (Signed)
Pt called and stated she wanted the location of the mammogram to be in [place] on Tuesday or Thursday early morning ?

## 2021-05-17 NOTE — Telephone Encounter (Signed)
Noted! Thank you

## 2021-05-18 NOTE — Telephone Encounter (Signed)
Lm for pt - is Tuesday AM appointment. ?She can call [facility] and switch location if she prefers - previous mammo done at main campus  ?

## 2021-05-31 ENCOUNTER — Telehealth: Payer: 59 | Admitting: Family Medicine

## 2021-05-31 DIAGNOSIS — N200 Calculus of kidney: Secondary | ICD-10-CM

## 2021-05-31 DIAGNOSIS — M549 Dorsalgia, unspecified: Secondary | ICD-10-CM

## 2021-05-31 NOTE — Progress Notes (Signed)
[place]  Needs to have in person for kidney stone eval. Patient acknowledged agreement and understanding of the plan.

## 2021-06-06 ENCOUNTER — Other Ambulatory Visit (HOSPITAL_COMMUNITY): Payer: Self-pay

## 2021-06-06 MED ORDER — AMPHETAMINE-DEXTROAMPHETAMINE 30 MG PO TABS
30.0000 mg | ORAL_TABLET | Freq: Two times a day (BID) | ORAL | 0 refills | Status: DC
Start: 2021-06-06 — End: 2023-11-27
  Filled 2021-06-06: qty 60, 30d supply, fill #0

## 2021-07-07 ENCOUNTER — Other Ambulatory Visit (HOSPITAL_COMMUNITY): Payer: Self-pay

## 2021-07-07 MED ORDER — AMPHETAMINE-DEXTROAMPHETAMINE 30 MG PO TABS
30.0000 mg | ORAL_TABLET | Freq: Two times a day (BID) | ORAL | 0 refills | Status: DC
  Filled 2021-08-10: qty 60, 30d supply, fill #0

## 2021-07-07 MED ORDER — AMPHETAMINE-DEXTROAMPHETAMINE 30 MG PO TABS
30.0000 mg | ORAL_TABLET | Freq: Two times a day (BID) | ORAL | 0 refills | Status: DC
  Filled 2021-07-07: qty 60, 30d supply, fill #0

## 2021-08-09 ENCOUNTER — Other Ambulatory Visit: Payer: Self-pay | Admitting: Internal Medicine

## 2021-08-10 ENCOUNTER — Other Ambulatory Visit (HOSPITAL_COMMUNITY): Payer: Self-pay

## 2021-08-30 ENCOUNTER — Other Ambulatory Visit: Payer: Self-pay | Admitting: Internal Medicine

## 2021-09-09 ENCOUNTER — Other Ambulatory Visit (HOSPITAL_COMMUNITY): Payer: Self-pay

## 2021-09-09 MED ORDER — AMPHETAMINE-DEXTROAMPHETAMINE 30 MG PO TABS
30.0000 mg | ORAL_TABLET | Freq: Two times a day (BID) | ORAL | 0 refills | Status: DC
  Filled 2021-09-09: qty 40, 20d supply, fill #0
  Filled 2021-09-09: qty 20, 10d supply, fill #0
  Filled 2021-09-09: qty 60, 30d supply, fill #0

## 2021-09-12 ENCOUNTER — Other Ambulatory Visit (HOSPITAL_COMMUNITY): Payer: Self-pay

## 2021-10-06 ENCOUNTER — Other Ambulatory Visit: Payer: Self-pay | Admitting: Internal Medicine

## 2021-10-13 ENCOUNTER — Other Ambulatory Visit (HOSPITAL_COMMUNITY): Payer: Self-pay

## 2021-10-13 MED ORDER — AMPHETAMINE-DEXTROAMPHETAMINE 30 MG PO TABS
30.0000 mg | ORAL_TABLET | Freq: Two times a day (BID) | ORAL | 0 refills | Status: DC
  Filled 2021-10-13 (×2): qty 60, 30d supply, fill #0

## 2021-10-17 ENCOUNTER — Other Ambulatory Visit (HOSPITAL_COMMUNITY): Payer: Self-pay

## 2021-10-17 MED ORDER — TRAZODONE HCL 50 MG PO TABS
50.0000 mg | ORAL_TABLET | Freq: Every evening | ORAL | 0 refills | Status: DC
  Filled 2021-10-17: qty 90, 30d supply, fill #0
  Filled 2021-11-14: qty 90, 30d supply, fill #1
  Filled 2022-05-16: qty 90, 30d supply, fill #2

## 2021-10-26 ENCOUNTER — Ambulatory Visit: Payer: 59 | Admitting: Internal Medicine

## 2021-10-27 ENCOUNTER — Other Ambulatory Visit (HOSPITAL_COMMUNITY): Payer: Self-pay

## 2021-10-27 ENCOUNTER — Ambulatory Visit: Payer: 59 | Admitting: Internal Medicine

## 2021-10-28 ENCOUNTER — Other Ambulatory Visit (HOSPITAL_COMMUNITY): Payer: Self-pay

## 2021-10-28 MED ORDER — GABAPENTIN 400 MG PO CAPS
400.0000 mg | ORAL_CAPSULE | Freq: Four times a day (QID) | ORAL | 0 refills | Status: DC | PRN
  Filled 2021-10-28: qty 120, 30d supply, fill #0

## 2021-10-28 MED ORDER — DESVENLAFAXINE SUCCINATE ER 100 MG PO TB24
100.0000 mg | ORAL_TABLET | Freq: Every morning | ORAL | 12 refills | Status: DC
  Filled 2021-10-28: qty 30, 30d supply, fill #0
  Filled 2021-11-30: qty 30, 30d supply, fill #1

## 2021-11-14 ENCOUNTER — Other Ambulatory Visit (HOSPITAL_COMMUNITY): Payer: Self-pay

## 2021-11-14 MED ORDER — AMPHETAMINE-DEXTROAMPHETAMINE 30 MG PO TABS
1.0000 | ORAL_TABLET | Freq: Two times a day (BID) | ORAL | 0 refills | Status: DC
  Filled 2021-11-14: qty 60, 30d supply, fill #0

## 2021-11-17 ENCOUNTER — Other Ambulatory Visit (HOSPITAL_COMMUNITY): Payer: Self-pay

## 2021-12-01 ENCOUNTER — Other Ambulatory Visit (HOSPITAL_COMMUNITY): Payer: Self-pay

## 2021-12-01 MED FILL — Metoprolol Succinate Tab ER 24HR 25 MG (Tartrate Equiv): ORAL | 30 days supply | Qty: 15 | Fill #0 | Status: AC

## 2021-12-02 ENCOUNTER — Other Ambulatory Visit (HOSPITAL_COMMUNITY): Payer: Self-pay

## 2021-12-05 ENCOUNTER — Other Ambulatory Visit (HOSPITAL_COMMUNITY): Payer: Self-pay

## 2021-12-05 MED ORDER — TRAZODONE HCL 50 MG PO TABS
50.0000 mg | ORAL_TABLET | Freq: Every evening | ORAL | 3 refills | Status: DC | PRN
  Filled 2021-12-05 – 2022-01-03 (×3): qty 90, 30d supply, fill #0
  Filled 2022-02-14: qty 90, 30d supply, fill #1
  Filled 2022-04-03: qty 90, 30d supply, fill #2
  Filled 2022-06-27: qty 90, 30d supply, fill #3
  Filled 2022-10-30: qty 90, 30d supply, fill #4

## 2021-12-05 MED ORDER — GABAPENTIN 400 MG PO CAPS
400.0000 mg | ORAL_CAPSULE | Freq: Four times a day (QID) | ORAL | 11 refills | Status: DC | PRN
  Filled 2021-12-05: qty 120, 30d supply, fill #0
  Filled 2022-02-09: qty 120, 30d supply, fill #1
  Filled 2022-04-19: qty 120, 30d supply, fill #2
  Filled 2022-06-30: qty 120, 30d supply, fill #3
  Filled 2022-08-11: qty 120, 30d supply, fill #4
  Filled 2022-10-26: qty 120, 30d supply, fill #5

## 2021-12-05 MED ORDER — DESVENLAFAXINE SUCCINATE ER 100 MG PO TB24
100.0000 mg | ORAL_TABLET | Freq: Every morning | ORAL | 11 refills | Status: DC
  Filled 2021-12-05 – 2021-12-26 (×2): qty 30, 30d supply, fill #0
  Filled 2022-01-27: qty 30, 30d supply, fill #1
  Filled 2022-02-24: qty 30, 30d supply, fill #2
  Filled 2022-03-28: qty 30, 30d supply, fill #3
  Filled 2022-05-04: qty 30, 30d supply, fill #4
  Filled 2022-05-30: qty 30, 30d supply, fill #5
  Filled 2022-06-29: qty 30, 30d supply, fill #6
  Filled 2022-07-31: qty 30, 30d supply, fill #7
  Filled 2022-08-24: qty 30, 30d supply, fill #8
  Filled 2022-09-25: qty 30, 30d supply, fill #9
  Filled 2022-10-23: qty 30, 30d supply, fill #10
  Filled 2022-11-20: qty 30, 30d supply, fill #11

## 2021-12-22 ENCOUNTER — Other Ambulatory Visit (HOSPITAL_COMMUNITY): Payer: Self-pay

## 2021-12-22 MED ORDER — AMPHETAMINE-DEXTROAMPHETAMINE 30 MG PO TABS
1.0000 | ORAL_TABLET | Freq: Two times a day (BID) | ORAL | 0 refills | Status: DC
  Filled 2021-12-22 – 2021-12-23 (×2): qty 60, 30d supply, fill #0

## 2021-12-23 ENCOUNTER — Other Ambulatory Visit (HOSPITAL_COMMUNITY): Payer: Self-pay

## 2021-12-26 ENCOUNTER — Other Ambulatory Visit: Payer: Self-pay | Admitting: Internal Medicine

## 2021-12-26 MED ORDER — AMLODIPINE BESYLATE 10 MG PO TABS
10.0000 mg | ORAL_TABLET | Freq: Every day | ORAL | 0 refills | Status: DC
  Filled 2021-12-26: qty 30, 30d supply, fill #0

## 2021-12-27 ENCOUNTER — Other Ambulatory Visit (HOSPITAL_COMMUNITY): Payer: Self-pay

## 2022-01-03 ENCOUNTER — Other Ambulatory Visit (HOSPITAL_COMMUNITY): Payer: Self-pay

## 2022-01-03 ENCOUNTER — Other Ambulatory Visit: Payer: Self-pay

## 2022-01-03 MED FILL — Metoprolol Succinate Tab ER 24HR 25 MG (Tartrate Equiv): ORAL | 30 days supply | Qty: 15 | Fill #1 | Status: CN

## 2022-01-03 MED FILL — Metoprolol Succinate Tab ER 24HR 25 MG (Tartrate Equiv): ORAL | 30 days supply | Qty: 15 | Fill #1 | Status: AC

## 2022-01-04 ENCOUNTER — Other Ambulatory Visit: Payer: Self-pay

## 2022-01-12 DIAGNOSIS — Z1231 Encounter for screening mammogram for malignant neoplasm of breast: Secondary | ICD-10-CM | POA: Diagnosis not present

## 2022-01-19 ENCOUNTER — Encounter: Payer: Self-pay | Admitting: Internal Medicine

## 2022-01-19 ENCOUNTER — Ambulatory Visit (INDEPENDENT_AMBULATORY_CARE_PROVIDER_SITE_OTHER): Payer: 59 | Admitting: Internal Medicine

## 2022-01-19 ENCOUNTER — Other Ambulatory Visit (HOSPITAL_COMMUNITY)
Admission: RE | Admit: 2022-01-19 | Discharge: 2022-01-19 | Disposition: A | Discharge status: Home / Self Care | Payer: 59 | Source: Ambulatory Visit | Attending: Internal Medicine | Admitting: Internal Medicine

## 2022-01-19 ENCOUNTER — Other Ambulatory Visit (HOSPITAL_COMMUNITY): Payer: Self-pay

## 2022-01-19 VITALS — BP 136/78 | HR 85 | Temp 97.8°F | Resp 16 | Ht 59.0 in | Wt 170.0 lb

## 2022-01-19 DIAGNOSIS — Z1231 Encounter for screening mammogram for malignant neoplasm of breast: Secondary | ICD-10-CM

## 2022-01-19 DIAGNOSIS — Z124 Encounter for screening for malignant neoplasm of cervix: Secondary | ICD-10-CM

## 2022-01-19 DIAGNOSIS — N760 Acute vaginitis: Secondary | ICD-10-CM

## 2022-01-19 DIAGNOSIS — Z13220 Encounter for screening for lipoid disorders: Secondary | ICD-10-CM | POA: Diagnosis not present

## 2022-01-19 DIAGNOSIS — L989 Disorder of the skin and subcutaneous tissue, unspecified: Secondary | ICD-10-CM

## 2022-01-19 DIAGNOSIS — I10 Essential (primary) hypertension: Secondary | ICD-10-CM

## 2022-01-19 DIAGNOSIS — Z1239 Encounter for other screening for malignant neoplasm of breast: Secondary | ICD-10-CM | POA: Insufficient documentation

## 2022-01-19 MED ORDER — MUPIROCIN 2 % EX OINT
1.0000 | TOPICAL_OINTMENT | Freq: Two times a day (BID) | CUTANEOUS | 0 refills | Status: DC
  Filled 2022-01-19: qty 22, 11d supply, fill #0

## 2022-01-19 MED ORDER — NORETHINDRONE 0.35 MG PO TABS
1.0000 | ORAL_TABLET | Freq: Every day | ORAL | 1 refills | Status: DC
  Filled 2022-01-19 – 2022-03-23 (×2): qty 28, 28d supply, fill #0
  Filled 2022-04-19: qty 28, 28d supply, fill #1
  Filled 2022-05-16: qty 28, 28d supply, fill #2
  Filled 2022-06-07: qty 28, 28d supply, fill #3
  Filled 2022-07-10: qty 28, 28d supply, fill #4
  Filled 2022-08-02: qty 28, 28d supply, fill #5

## 2022-01-19 MED ORDER — METOPROLOL SUCCINATE ER 25 MG PO TB24
12.5000 mg | ORAL_TABLET | Freq: Every day | ORAL | 1 refills | Status: DC
  Filled 2022-01-19 – 2022-01-31 (×2): qty 15, 30d supply, fill #0
  Filled 2022-02-27: qty 15, 30d supply, fill #1
  Filled 2022-05-04: qty 15, 30d supply, fill #2
  Filled 2022-05-23 – 2022-05-30 (×3): qty 15, 30d supply, fill #3
  Filled 2022-06-29: qty 15, 30d supply, fill #4
  Filled 2022-07-31: qty 15, 30d supply, fill #5

## 2022-01-19 MED ORDER — AMLODIPINE BESYLATE 10 MG PO TABS
10.0000 mg | ORAL_TABLET | Freq: Every day | ORAL | 1 refills | Status: DC
  Filled 2022-01-19 – 2022-02-07 (×2): qty 30, 30d supply, fill #0
  Filled 2022-03-10: qty 30, 30d supply, fill #1
  Filled 2022-04-12: qty 30, 30d supply, fill #2
  Filled 2022-05-08: qty 30, 30d supply, fill #3
  Filled 2022-06-02: qty 30, 30d supply, fill #4
  Filled 2022-07-03: qty 30, 30d supply, fill #5

## 2022-01-19 NOTE — Assessment & Plan Note (Signed)
PAP 05/07/21 - negative with negative HPV.

## 2022-01-19 NOTE — Assessment & Plan Note (Signed)
Blood pressure as outlined.  Doing well.  Continue toprol and amlodipine.  Follow pressures.  Follow metabolic panel.

## 2022-01-19 NOTE — Progress Notes (Signed)
Subjective:    Patient ID: Angelica May, female    DOB: October 01, 1975, 47 y.o.   MRN: [id number]  Patient here for  Chief Complaint  Patient presents with   Medical Management of Chronic Issues    HPI Here to follow up regarding hypercholesterolemia and hypertension.  Mammogram at [facility] - last week.  Obtain results.  Staying active.  Exercising 3 days per week.  No chest pain or sob reported.  No abdominal pain or bowel change reported.  Due colonoscopy.  Discussed.  Will notify me when agreeable.  States blood pressures at home 120s/70-80s.  Lesion - right cheek - persistent over the last few weeks.  Also lesion over lip - just started yesterday.  Persistent skin tag.  Discussed dermatology referral.  Reports some intermittent vaginal irritation.  Request swab today.     Past Medical History:  Diagnosis Date   Anxiety    Depression    Hyperlipemia    Hypertension    Nephrolithiasis    Past Surgical History:  Procedure Laterality Date   EXTRACORPOREAL SHOCK WAVE LITHOTRIPSY Right 06/15/2016   Procedure: EXTRACORPOREAL SHOCK WAVE LITHOTRIPSY (ESWL);  Surgeon: Hollice Espy, MD;  Location: [facility] ORS;  Service: Urology;  Laterality: Right;   LITHOTRIPSY  2007   LITHOTRIPSY  06/15/2016   WISDOM TOOTH EXTRACTION     Family History  Problem Relation Age of Onset   Breast cancer Maternal Grandmother    Breast cancer Cousin    Prostate cancer Paternal Uncle    Asthma Mother    Depression Mother    Diabetes Mother    Hypertension Mother    Depression Father    Hyperlipidemia Father    Hypertension Father    Stroke Father    Alcohol abuse Brother    Depression Brother    Hypertension Brother    Hyperlipidemia Brother    Hypertension Brother    Hyperlipidemia Brother    Hyperlipidemia Paternal Grandmother    Hypertension Paternal Grandmother    Kidney disease Paternal Grandmother    Stroke Paternal Grandmother    Kidney cancer Neg Hx    Social History   Socioeconomic  History   Marital status: Single    Spouse name: Not on file   Number of children: Not on file   Years of education: Not on file   Highest education level: Not on file  Occupational History   Not on file  Tobacco Use   Smoking status: Never   Smokeless tobacco: Never  Substance and Sexual Activity   Alcohol use: Yes    Comment: occ   Drug use: No   Sexual activity: Yes  Other Topics Concern   Not on file  Social History Narrative   Not on file   Social Determinants of Health   Financial Resource Strain: Not on file  Food Insecurity: Not on file  Transportation Needs: Not on file  Physical Activity: Not on file  Stress: Not on file  Social Connections: Not on file     Review of Systems  Constitutional:  Negative for appetite change and unexpected weight change.  HENT:  Negative for congestion and sinus pressure.   Respiratory:  Negative for cough, chest tightness and shortness of breath.   Cardiovascular:  Negative for chest pain, palpitations and leg swelling.  Gastrointestinal:  Negative for abdominal pain, diarrhea, nausea and vomiting.  Genitourinary:  Negative for difficulty urinating and dysuria.  Musculoskeletal:  Negative for joint swelling and myalgias.  Skin:  Negative for color change and rash.       Skin lesion - check and over lip as outlined.   Neurological:  Negative for dizziness and headaches.  Psychiatric/Behavioral:  Negative for agitation and dysphoric mood.        Objective:     BP 136/78   Pulse 85   Temp 97.8 F (36.6 C)   Resp 16   Ht 4\' 11"  (1.499 m)   Wt 170 lb (77.1 kg)   SpO2 98%   BMI 34.34 kg/m  Wt Readings from Last 3 Encounters:  01/19/22 170 lb (77.1 kg)  04/26/21 155 lb 12.8 oz (70.7 kg)  03/05/18 161 lb 9.6 oz (73.3 kg)    Physical Exam Vitals reviewed.  Constitutional:      General: She is not in acute distress.    Appearance: Normal appearance.  HENT:     Head: Normocephalic and atraumatic.     Right Ear:  External ear normal.     Left Ear: External ear normal.  Eyes:     General: No scleral icterus.       Right eye: No discharge.        Left eye: No discharge.     Conjunctiva/sclera: Conjunctivae normal.  Neck:     Thyroid: No thyromegaly.  Cardiovascular:     Rate and Rhythm: Normal rate and regular rhythm.  Pulmonary:     Effort: No respiratory distress.     Breath sounds: Normal breath sounds. No wheezing.  Abdominal:     General: Bowel sounds are normal.     Palpations: Abdomen is soft.     Tenderness: There is no abdominal tenderness.  Genitourinary:    Comments: Normal external genitalia.  Vaginal vault without lesions.  KOH/wet prep obtained.  Could not appreciate any adnexal masses or tenderness.   Musculoskeletal:        General: No swelling or tenderness.     Cervical back: Neck supple. No tenderness.  Lymphadenopathy:     Cervical: No cervical adenopathy.  Skin:    Findings: No erythema or rash.     Comments: Skin lesion - over lip and cheek.   Neurological:     Mental Status: She is alert.  Psychiatric:        Mood and Affect: Mood normal.        Behavior: Behavior normal.      Outpatient Encounter Medications as of 01/19/2022  Medication Sig   amLODipine (NORVASC) 10 MG tablet Take 1 tablet (10 mg total) by mouth daily.   amphetamine-dextroamphetamine (ADDERALL) 30 MG tablet TK 1 T PO EVERY MORNING AND EVERY DAY AT NOON   amphetamine-dextroamphetamine (ADDERALL) 30 MG tablet Take 1 tablet by mouth 2 (two) times daily.   amphetamine-dextroamphetamine (ADDERALL) 30 MG tablet Take 1 tablet by mouth 2 (two) times daily.   amphetamine-dextroamphetamine (ADDERALL) 30 MG tablet Take 1 tablet by mouth 2 (two) times daily.   amphetamine-dextroamphetamine (ADDERALL) 30 MG tablet Take 1 tablet by mouth 2 (two) times daily.   desvenlafaxine (PRISTIQ) 100 MG 24 hr tablet Take 100 mg by mouth daily.   desvenlafaxine (PRISTIQ) 100 MG 24 hr tablet Take 1 tablet (100 mg total)  by mouth in the morning.   desvenlafaxine (PRISTIQ) 100 MG 24 hr tablet Take 1 tablet (100 mg total) by mouth in the morning.   gabapentin (NEURONTIN) 400 MG capsule Take 1 capsule (400 mg total) by mouth 4 (four) times daily as needed.   LORazepam (ATIVAN)  1 MG tablet    metoprolol succinate (TOPROL-XL) 25 MG 24 hr tablet Take 0.5 tablets (12.5 mg total) by mouth daily.   metroNIDAZOLE (FLAGYL) 500 MG tablet Take 1 tablet (500 mg total) by mouth 2 (two) times daily.   mupirocin ointment (BACTROBAN) 2 % Apply 1 application topically 2 (two) times daily to affected area   norethindrone (MICRONOR) 0.35 MG tablet Take 1 tablet (0.35 mg total) by mouth daily.   traZODone (DESYREL) 50 MG tablet Take 50 mg by mouth at bedtime.   traZODone (DESYREL) 50 MG tablet Take 1-3 tablets (50-150 mg total) by mouth every night   traZODone (DESYREL) 50 MG tablet Take 1-3 tablets (50-150 mg total) by mouth at bedtime as needed.   [DISCONTINUED] amLODipine (NORVASC) 10 MG tablet Take 1 tablet (10 mg total) by mouth daily.   [DISCONTINUED] metoprolol succinate (TOPROL-XL) 25 MG 24 hr tablet Take 0.5 tablets (12.5 mg total) by mouth daily.   [DISCONTINUED] mupirocin ointment (BACTROBAN) 2 % Apply 1 application topically 2 (two) times daily to affected area   [DISCONTINUED] norethindrone (MICRONOR) 0.35 MG tablet TAKE ONE TABLET BY MOUTH ONE TIME DAILY   No facility-administered encounter medications on file as of 01/19/2022.     Lab Results  Component Value Date   WBC 7.8 04/26/2021   HGB 13.3 04/26/2021   HCT 39.7 04/26/2021   PLT 346.0 04/26/2021   GLUCOSE 86 04/26/2021   CHOL 198 04/26/2021   TRIG 87.0 04/26/2021   HDL 66.70 04/26/2021   LDLCALC 114 (H) 04/26/2021   ALT 12 04/26/2021   AST 13 04/26/2021   NA 137 04/26/2021   K 4.1 04/26/2021   CL 101 04/26/2021   CREATININE 0.92 04/26/2021   BUN 9 04/26/2021   CO2 28 04/26/2021   TSH 2.94 04/26/2021    No results found.     Assessment & Plan:   Hypertension, essential Assessment & Plan: Blood pressure as outlined.  Doing well.  Continue toprol and amlodipine.  Follow pressures.  Follow metabolic panel.    Screening for lipid disorders  Cervical cancer screening Assessment & Plan: PAP 05/07/21 - negative with negative HPV.    Encounter for screening mammogram for malignant neoplasm of breast Assessment & Plan: Overdue mammogram.  Need to schedule    Acute vaginitis Assessment & Plan: KOH/wet prep obtained.    Orders: -     Cervicovaginal ancillary only  Skin lesion Assessment & Plan: Cheek and lip.  Discussed referral to dermatology.  Bactroban - over lip.  Follow.    Other orders -     amLODIPine Besylate; Take 1 tablet (10 mg total) by mouth daily.  Dispense: 90 tablet; Refill: 1 -     Metoprolol Succinate ER; Take 0.5 tablets (12.5 mg total) by mouth daily.  Dispense: 45 tablet; Refill: 1 -     Norethindrone; Take 1 tablet (0.35 mg total) by mouth daily.  Dispense: 84 tablet; Refill: 1 -     Mupirocin; Apply 1 application topically 2 (two) times daily to affected area  Dispense: 22 g; Refill: 0     Einar Pheasant, MD

## 2022-01-19 NOTE — Assessment & Plan Note (Signed)
Overdue mammogram.  Need to schedule.   

## 2022-01-20 ENCOUNTER — Other Ambulatory Visit (HOSPITAL_COMMUNITY): Payer: Self-pay

## 2022-01-20 ENCOUNTER — Other Ambulatory Visit: Payer: Self-pay

## 2022-01-20 LAB — CERVICOVAGINAL ANCILLARY ONLY
Bacterial Vaginitis (gardnerella): POSITIVE — AB
Candida Glabrata: NEGATIVE
Candida Vaginitis: NEGATIVE
Comment: NEGATIVE
Comment: NEGATIVE
Comment: NEGATIVE

## 2022-01-20 MED ORDER — METRONIDAZOLE 500 MG PO TABS
500.0000 mg | ORAL_TABLET | Freq: Two times a day (BID) | ORAL | 0 refills | Status: AC
  Filled 2022-01-20: qty 14, 7d supply, fill #0

## 2022-01-22 ENCOUNTER — Encounter: Payer: Self-pay | Admitting: Internal Medicine

## 2022-01-22 DIAGNOSIS — L989 Disorder of the skin and subcutaneous tissue, unspecified: Secondary | ICD-10-CM | POA: Insufficient documentation

## 2022-01-22 NOTE — Assessment & Plan Note (Signed)
Cheek and lip.  Discussed referral to dermatology.  Bactroban - over lip.  Follow.

## 2022-01-22 NOTE — Assessment & Plan Note (Signed)
KOH/wet prep obtained.   

## 2022-01-24 ENCOUNTER — Other Ambulatory Visit (HOSPITAL_COMMUNITY): Payer: Self-pay

## 2022-01-24 MED ORDER — LORAZEPAM 1 MG PO TABS
1.0000 mg | ORAL_TABLET | Freq: Two times a day (BID) | ORAL | 2 refills | Status: DC | PRN
  Filled 2022-01-24: qty 60, 30d supply, fill #0

## 2022-01-24 MED ORDER — AMPHETAMINE-DEXTROAMPHETAMINE 30 MG PO TABS
1.0000 | ORAL_TABLET | Freq: Two times a day (BID) | ORAL | 0 refills | Status: DC
  Filled 2022-01-24: qty 60, 30d supply, fill #0

## 2022-01-25 ENCOUNTER — Other Ambulatory Visit (HOSPITAL_COMMUNITY): Payer: Self-pay

## 2022-01-31 ENCOUNTER — Other Ambulatory Visit (HOSPITAL_COMMUNITY): Payer: Self-pay

## 2022-01-31 ENCOUNTER — Encounter: Payer: Self-pay | Admitting: Internal Medicine

## 2022-01-31 LAB — HM MAMMOGRAPHY

## 2022-02-07 ENCOUNTER — Other Ambulatory Visit (HOSPITAL_COMMUNITY): Payer: Self-pay

## 2022-02-09 ENCOUNTER — Other Ambulatory Visit (HOSPITAL_COMMUNITY): Payer: Self-pay

## 2022-02-14 ENCOUNTER — Other Ambulatory Visit (HOSPITAL_COMMUNITY): Payer: Self-pay

## 2022-02-23 ENCOUNTER — Ambulatory Visit
Admission: RE | Admit: 2022-02-23 | Discharge: 2022-02-23 | Disposition: A | Discharge status: Home / Self Care | Payer: 59 | Source: Ambulatory Visit | Attending: Urgent Care | Admitting: Urgent Care

## 2022-02-23 VITALS — BP 158/98 | HR 85 | Temp 98.6°F | Resp 18

## 2022-02-23 DIAGNOSIS — B9689 Other specified bacterial agents as the cause of diseases classified elsewhere: Secondary | ICD-10-CM

## 2022-02-23 DIAGNOSIS — J019 Acute sinusitis, unspecified: Secondary | ICD-10-CM | POA: Diagnosis not present

## 2022-02-23 MED ORDER — DOXYCYCLINE HYCLATE 100 MG PO CAPS: 100.0000 mg | ORAL_CAPSULE | Freq: Two times a day (BID) | ORAL | 0 refills | Status: DC

## 2022-02-23 MED ORDER — DOXYCYCLINE HYCLATE 100 MG PO CAPS
100.0000 mg | ORAL_CAPSULE | Freq: Two times a day (BID) | ORAL | 0 refills | Status: AC
  Filled 2022-02-23 – 2022-02-24 (×2): qty 14, 7d supply, fill #0

## 2022-02-23 NOTE — Discharge Instructions (Addendum)
Follow up here or with your primary care provider if your symptoms are worsening or not improving with treatment.     

## 2022-02-23 NOTE — ED Provider Notes (Signed)
Roderic Palau    CSN: [id number] Arrival date & time: 02/23/22  1846      History   Chief Complaint Chief Complaint  Patient presents with   Nasal Congestion    cold lasting for more than 7 days - Entered by patient   Cough    HPI Angelica May is a 47 y.o. female.    Cough   Presents to urgent care with concern for rhinorrhea, productive cough, nasal congestion, headache.  She endorses sinus pressure with symptoms starting about 9 days ago.  Denies fever.  Past Medical History:  Diagnosis Date   Anxiety    Depression    Hyperlipemia    Hypertension    Nephrolithiasis     Patient Active Problem List   Diagnosis Date Noted   Skin lesion 01/22/2022   Breast cancer screening 01/19/2022   Vaginitis 05/07/2021   Hoarseness 01/17/2018   Abnormal mammogram 01/17/2018   Healthcare maintenance 05/10/2017   Nephrolithiasis 04/15/2017   Cervical cancer screening 04/15/2017   Itching 04/15/2017   Hypertension, essential 04/12/2017   Amenorrhea 04/12/2017    Past Surgical History:  Procedure Laterality Date   EXTRACORPOREAL SHOCK WAVE LITHOTRIPSY Right 06/15/2016   Procedure: EXTRACORPOREAL SHOCK WAVE LITHOTRIPSY (ESWL);  Surgeon: Hollice Espy, MD;  Location: [facility] ORS;  Service: Urology;  Laterality: Right;   LITHOTRIPSY  2007   LITHOTRIPSY  06/15/2016   WISDOM TOOTH EXTRACTION      OB History   No obstetric history on file.      Home Medications    Prior to Admission medications   Medication Sig Start Date End Date Taking? Authorizing Provider  amLODipine (NORVASC) 10 MG tablet Take 1 tablet (10 mg total) by mouth daily. 01/19/22   Einar Pheasant, MD  amphetamine-dextroamphetamine (ADDERALL) 30 MG tablet TK 1 T PO EVERY MORNING AND EVERY DAY AT NOON 03/18/16   [provider]  amphetamine-dextroamphetamine (ADDERALL) 30 MG tablet Take 1 tablet by mouth 2 (two) times daily. 06/06/21     amphetamine-dextroamphetamine (ADDERALL) 30 MG  tablet Take 1 tablet by mouth 2 (two) times daily. 07/07/21     amphetamine-dextroamphetamine (ADDERALL) 30 MG tablet Take 1 tablet by mouth 2 (two) times daily. 10/13/21     amphetamine-dextroamphetamine (ADDERALL) 30 MG tablet Take 1 tablet by mouth 2 (two) times daily. 01/24/22     desvenlafaxine (PRISTIQ) 100 MG 24 hr tablet Take 100 mg by mouth daily.    [provider]  desvenlafaxine (PRISTIQ) 100 MG 24 hr tablet Take 1 tablet (100 mg total) by mouth in the morning. 12/17/20     desvenlafaxine (PRISTIQ) 100 MG 24 hr tablet Take 1 tablet (100 mg total) by mouth in the morning. 12/05/21     gabapentin (NEURONTIN) 400 MG capsule Take 1 capsule (400 mg total) by mouth 4 (four) times daily as needed. 12/05/21     LORazepam (ATIVAN) 1 MG tablet  08/01/19   [provider]  LORazepam (ATIVAN) 1 MG tablet Take 1 tablet (1 mg total) by mouth 2 (two) times daily as needed. 08/31/21     metoprolol succinate (TOPROL-XL) 25 MG 24 hr tablet Take 1/2 tablet (12.5 mg total) by mouth daily. 01/19/22   Einar Pheasant, MD  metroNIDAZOLE (FLAGYL) 500 MG tablet Take 1 tablet (500 mg total) by mouth 2 (two) times daily. 04/29/21   Einar Pheasant, MD  mupirocin ointment (BACTROBAN) 2 % Apply 1 application topically 2 (two) times daily to affected area 01/19/22  Einar Pheasant, MD  norethindrone (MICRONOR) 0.35 MG tablet Take 1 tablet (0.35 mg total) by mouth daily. 01/19/22   Einar Pheasant, MD  traZODone (DESYREL) 50 MG tablet Take 50 mg by mouth at bedtime.    [provider]  traZODone (DESYREL) 50 MG tablet Take 1-3 tablets (50-150 mg total) by mouth every night 06/14/21   Chucky May, MD  traZODone (DESYREL) 50 MG tablet Take 1-3 tablets (50-150 mg total) by mouth at bedtime as needed. 12/05/21       Family History Family History  Problem Relation Age of Onset   Breast cancer Maternal Grandmother    Breast cancer Cousin    Prostate cancer Paternal Uncle    Asthma Mother     Depression Mother    Diabetes Mother    Hypertension Mother    Depression Father    Hyperlipidemia Father    Hypertension Father    Stroke Father    Alcohol abuse Brother    Depression Brother    Hypertension Brother    Hyperlipidemia Brother    Hypertension Brother    Hyperlipidemia Brother    Hyperlipidemia Paternal Grandmother    Hypertension Paternal Grandmother    Kidney disease Paternal Grandmother    Stroke Paternal Grandmother    Kidney cancer Neg Hx     Social History Social History   Tobacco Use   Smoking status: Never   Smokeless tobacco: Never  Substance Use Topics   Alcohol use: Yes    Comment: occ   Drug use: No     Allergies   Penicillins   Review of Systems Review of Systems  Respiratory:  Positive for cough.      Physical Exam Triage Vital Signs ED Triage Vitals  Enc Vitals Group     BP 02/23/22 1854 (!) 158/98     Pulse Rate 02/23/22 1854 85     Resp 02/23/22 1854 18     Temp 02/23/22 1854 98.6 F (37 C)     Temp Source 02/23/22 1854 Oral     SpO2 02/23/22 1854 95 %     Weight --      Height --      Head Circumference --      Peak Flow --      Pain Score 02/23/22 1857 2     Pain Loc --      Pain Edu? --      Excl. in [place]? --    No data found.  Updated Vital Signs BP (!) 158/98 (BP Location: Left Arm)   Pulse 85   Temp 98.6 F (37 C) (Oral)   Resp 18   SpO2 95%   Visual Acuity Right Eye Distance:   Left Eye Distance:   Bilateral Distance:    Right Eye Near:   Left Eye Near:    Bilateral Near:     Physical Exam Vitals reviewed.  Constitutional:      Appearance: Normal appearance. She is not ill-appearing.  HENT:     Mouth/Throat:     Pharynx: No oropharyngeal exudate or posterior oropharyngeal erythema.  Cardiovascular:     Rate and Rhythm: Normal rate and regular rhythm.     Pulses: Normal pulses.     Heart sounds: Normal heart sounds.  Pulmonary:     Effort: Pulmonary effort is normal.     Breath sounds:  Normal breath sounds.  Skin:    General: Skin is warm and dry.  Neurological:     General:  No focal deficit present.     Mental Status: She is alert and oriented to person, place, and time.  Psychiatric:        Mood and Affect: Mood normal.        Behavior: Behavior normal.      UC Treatments / Results  Labs (all labs ordered are listed, but only abnormal results are displayed) Labs Reviewed - No data to display  EKG   Radiology No results found.  Procedures Procedures (including critical care time)  Medications Ordered in UC Medications - No data to display  Initial Impression / Assessment and Plan / UC Course  I have reviewed the triage vital signs and the nursing notes.  Pertinent labs & imaging results that were available during my care of the patient were reviewed by me and considered in my medical decision making (see chart for details).   Patient is afebrile here without recent antipyretics. Satting well on room air. Overall is well appearing, well hydrated, without respiratory distress. Pulmonary exam is unremarkable.  Lungs CTAB without wheezing, rhonchi, rales.  Symptoms are consistent with onset of bacterial sinusitis secondary to past viral URI.  Will treat her symptoms with doxycycline given her intolerance for penicillins.  Final diagnoses:  None   Discharge Instructions   None    ED Prescriptions   None    PDMP not reviewed this encounter.   Rose Phi,  02/23/22 [phone]

## 2022-02-23 NOTE — ED Triage Notes (Signed)
Pt c/o runny nose, cough, congestion and headache for about a week.   Home interventions: dayquil, nyquil

## 2022-02-24 ENCOUNTER — Other Ambulatory Visit (HOSPITAL_COMMUNITY): Payer: Self-pay

## 2022-02-24 MED ORDER — LORAZEPAM 1 MG PO TABS
1.0000 mg | ORAL_TABLET | Freq: Two times a day (BID) | ORAL | 2 refills | Status: DC | PRN
  Filled 2022-02-24: qty 60, 30d supply, fill #0
  Filled 2022-03-23: qty 60, 30d supply, fill #1
  Filled 2022-04-24: qty 60, 30d supply, fill #2

## 2022-02-24 MED ORDER — AMPHETAMINE-DEXTROAMPHETAMINE 30 MG PO TABS
1.0000 | ORAL_TABLET | Freq: Two times a day (BID) | ORAL | 0 refills | Status: DC
  Filled 2022-02-24: qty 60, 30d supply, fill #0

## 2022-03-23 ENCOUNTER — Other Ambulatory Visit (HOSPITAL_COMMUNITY): Payer: Self-pay

## 2022-03-23 MED ORDER — AMPHETAMINE-DEXTROAMPHETAMINE 30 MG PO TABS
1.0000 | ORAL_TABLET | Freq: Two times a day (BID) | ORAL | 0 refills | Status: DC
  Filled 2022-03-24: qty 8, 4d supply, fill #0
  Filled 2022-03-24: qty 52, 26d supply, fill #0

## 2022-03-24 ENCOUNTER — Other Ambulatory Visit (HOSPITAL_COMMUNITY): Payer: Self-pay

## 2022-03-27 ENCOUNTER — Other Ambulatory Visit: Payer: Self-pay

## 2022-03-29 ENCOUNTER — Other Ambulatory Visit (HOSPITAL_COMMUNITY): Payer: Self-pay

## 2022-04-24 ENCOUNTER — Other Ambulatory Visit: Payer: Self-pay

## 2022-05-04 ENCOUNTER — Other Ambulatory Visit (HOSPITAL_COMMUNITY): Payer: Self-pay

## 2022-05-04 MED ORDER — AMPHETAMINE-DEXTROAMPHETAMINE 30 MG PO TABS
30.0000 mg | ORAL_TABLET | Freq: Two times a day (BID) | ORAL | 0 refills | Status: DC
  Filled 2022-05-04: qty 60, 30d supply, fill #0

## 2022-05-23 ENCOUNTER — Other Ambulatory Visit (HOSPITAL_COMMUNITY): Payer: Self-pay

## 2022-05-24 ENCOUNTER — Other Ambulatory Visit (HOSPITAL_COMMUNITY): Payer: Self-pay

## 2022-05-25 DIAGNOSIS — F5101 Primary insomnia: Secondary | ICD-10-CM | POA: Diagnosis not present

## 2022-05-25 DIAGNOSIS — F3342 Major depressive disorder, recurrent, in full remission: Secondary | ICD-10-CM | POA: Diagnosis not present

## 2022-05-25 DIAGNOSIS — F900 Attention-deficit hyperactivity disorder, predominantly inattentive type: Secondary | ICD-10-CM | POA: Diagnosis not present

## 2022-05-26 ENCOUNTER — Other Ambulatory Visit (HOSPITAL_COMMUNITY): Payer: Self-pay

## 2022-05-26 MED ORDER — LORAZEPAM 1 MG PO TABS
1.0000 mg | ORAL_TABLET | Freq: Two times a day (BID) | ORAL | 2 refills | Status: DC | PRN
  Filled 2022-05-26: qty 60, 30d supply, fill #0
  Filled 2022-06-30: qty 60, 30d supply, fill #1
  Filled 2022-07-31: qty 60, 30d supply, fill #2

## 2022-05-27 ENCOUNTER — Other Ambulatory Visit (HOSPITAL_COMMUNITY): Payer: Self-pay

## 2022-05-30 ENCOUNTER — Other Ambulatory Visit (HOSPITAL_COMMUNITY): Payer: Self-pay

## 2022-05-30 ENCOUNTER — Other Ambulatory Visit: Payer: Self-pay

## 2022-05-30 ENCOUNTER — Other Ambulatory Visit: Payer: Self-pay | Admitting: Internal Medicine

## 2022-06-02 ENCOUNTER — Other Ambulatory Visit: Payer: Self-pay | Admitting: Internal Medicine

## 2022-06-02 ENCOUNTER — Other Ambulatory Visit (HOSPITAL_COMMUNITY): Payer: Self-pay

## 2022-06-06 ENCOUNTER — Other Ambulatory Visit (HOSPITAL_COMMUNITY): Payer: Self-pay

## 2022-06-06 MED ORDER — MUPIROCIN 2 % EX OINT
1.0000 | TOPICAL_OINTMENT | Freq: Two times a day (BID) | CUTANEOUS | 0 refills | Status: AC
  Filled 2022-06-06: qty 22, 10d supply, fill #0

## 2022-06-06 NOTE — Telephone Encounter (Signed)
I have refilled x 1, but need to confirm ok.  Any acute issues - infection?

## 2022-06-07 ENCOUNTER — Other Ambulatory Visit (HOSPITAL_COMMUNITY): Payer: Self-pay

## 2022-06-07 MED ORDER — AMPHETAMINE-DEXTROAMPHETAMINE 30 MG PO TABS
1.0000 | ORAL_TABLET | Freq: Two times a day (BID) | ORAL | 0 refills | Status: DC
  Filled 2022-06-07: qty 60, 30d supply, fill #0

## 2022-06-08 ENCOUNTER — Other Ambulatory Visit (HOSPITAL_COMMUNITY): Payer: Self-pay

## 2022-06-23 NOTE — Telephone Encounter (Signed)
FYI: Pt mychart response:  No just trying to clear up one spot ! Thank you

## 2022-06-27 ENCOUNTER — Other Ambulatory Visit (HOSPITAL_COMMUNITY): Payer: Self-pay

## 2022-06-30 ENCOUNTER — Other Ambulatory Visit: Payer: Self-pay

## 2022-07-03 ENCOUNTER — Other Ambulatory Visit (HOSPITAL_COMMUNITY): Payer: Self-pay

## 2022-07-18 ENCOUNTER — Other Ambulatory Visit (HOSPITAL_COMMUNITY): Payer: Self-pay

## 2022-07-18 MED ORDER — AMPHETAMINE-DEXTROAMPHETAMINE 30 MG PO TABS
30.0000 mg | ORAL_TABLET | Freq: Two times a day (BID) | ORAL | 0 refills | Status: DC
  Filled 2022-07-18: qty 60, 30d supply, fill #0

## 2022-07-20 ENCOUNTER — Encounter: Payer: 59 | Admitting: Internal Medicine

## 2022-07-31 ENCOUNTER — Other Ambulatory Visit: Payer: Self-pay

## 2022-07-31 ENCOUNTER — Other Ambulatory Visit (HOSPITAL_COMMUNITY): Payer: Self-pay

## 2022-07-31 ENCOUNTER — Other Ambulatory Visit: Payer: Self-pay | Admitting: Internal Medicine

## 2022-07-31 MED ORDER — TRAZODONE HCL 50 MG PO TABS
50.0000 mg | ORAL_TABLET | Freq: Every evening | ORAL | 4 refills | Status: DC | PRN
Start: 2022-07-31 — End: 2023-11-27
  Filled 2022-07-31: qty 90, 30d supply, fill #0
  Filled 2022-09-01: qty 90, 30d supply, fill #1
  Filled 2022-10-06: qty 90, 30d supply, fill #2
  Filled 2022-10-27: qty 90, 30d supply, fill #3

## 2022-08-01 ENCOUNTER — Other Ambulatory Visit (HOSPITAL_COMMUNITY): Payer: Self-pay

## 2022-08-01 MED ORDER — AMLODIPINE BESYLATE 10 MG PO TABS
10.0000 mg | ORAL_TABLET | Freq: Every day | ORAL | 1 refills | Status: DC
  Filled 2022-08-01: qty 30, 30d supply, fill #0
  Filled 2022-09-01: qty 30, 30d supply, fill #1
  Filled 2022-09-26: qty 30, 30d supply, fill #2
  Filled 2022-10-26: qty 30, 30d supply, fill #3
  Filled 2022-11-27: qty 30, 30d supply, fill #4
  Filled 2022-12-08: qty 30, 30d supply, fill #5

## 2022-08-02 ENCOUNTER — Other Ambulatory Visit (HOSPITAL_COMMUNITY): Payer: Self-pay

## 2022-08-02 ENCOUNTER — Other Ambulatory Visit: Payer: Self-pay

## 2022-08-16 ENCOUNTER — Other Ambulatory Visit (HOSPITAL_COMMUNITY): Payer: Self-pay

## 2022-08-18 ENCOUNTER — Other Ambulatory Visit: Payer: Self-pay

## 2022-08-18 ENCOUNTER — Other Ambulatory Visit (HOSPITAL_COMMUNITY): Payer: Self-pay

## 2022-08-18 MED ORDER — AMPHETAMINE-DEXTROAMPHETAMINE 30 MG PO TABS
1.0000 | ORAL_TABLET | Freq: Two times a day (BID) | ORAL | 0 refills | Status: DC
  Filled 2022-08-18: qty 60, 30d supply, fill #0

## 2022-08-24 ENCOUNTER — Other Ambulatory Visit: Payer: Self-pay | Admitting: Internal Medicine

## 2022-08-24 ENCOUNTER — Other Ambulatory Visit: Payer: Self-pay

## 2022-08-24 ENCOUNTER — Other Ambulatory Visit (HOSPITAL_COMMUNITY): Payer: Self-pay

## 2022-08-24 MED ORDER — METOPROLOL SUCCINATE ER 25 MG PO TB24
12.5000 mg | ORAL_TABLET | Freq: Every day | ORAL | 1 refills | Status: DC
Start: 2022-08-24 — End: 2023-02-19
  Filled 2022-08-24: qty 15, 30d supply, fill #0
  Filled 2022-09-25: qty 15, 30d supply, fill #1
  Filled 2022-10-23: qty 15, 30d supply, fill #2
  Filled 2022-11-20: qty 15, 30d supply, fill #3
  Filled 2022-12-19: qty 15, 30d supply, fill #4
  Filled 2023-01-18: qty 15, 30d supply, fill #5

## 2022-08-29 ENCOUNTER — Other Ambulatory Visit: Payer: Self-pay | Admitting: Internal Medicine

## 2022-08-29 ENCOUNTER — Other Ambulatory Visit (HOSPITAL_COMMUNITY): Payer: Self-pay

## 2022-08-29 MED ORDER — NORETHINDRONE 0.35 MG PO TABS
1.0000 | ORAL_TABLET | Freq: Every day | ORAL | 1 refills | Status: DC
Start: 2022-08-29 — End: 2023-02-13
  Filled 2022-08-29: qty 28, 28d supply, fill #0
  Filled 2022-09-28: qty 28, 28d supply, fill #1
  Filled 2022-10-23: qty 28, 28d supply, fill #2
  Filled 2022-11-20: qty 28, 28d supply, fill #3
  Filled 2022-12-19: qty 28, 28d supply, fill #4
  Filled 2023-01-14: qty 28, 28d supply, fill #5

## 2022-08-29 MED ORDER — LORAZEPAM 1 MG PO TABS
1.0000 mg | ORAL_TABLET | Freq: Two times a day (BID) | ORAL | 1 refills | Status: DC | PRN
Start: 2022-08-30 — End: 2023-11-27
  Filled 2022-09-01: qty 60, 30d supply, fill #0
  Filled 2022-09-26: qty 60, 30d supply, fill #1

## 2022-08-30 ENCOUNTER — Other Ambulatory Visit: Payer: Self-pay

## 2022-09-01 ENCOUNTER — Other Ambulatory Visit (HOSPITAL_COMMUNITY): Payer: Self-pay

## 2022-09-25 ENCOUNTER — Other Ambulatory Visit: Payer: Self-pay

## 2022-09-25 ENCOUNTER — Other Ambulatory Visit (HOSPITAL_COMMUNITY): Payer: Self-pay

## 2022-09-25 MED ORDER — AMPHETAMINE-DEXTROAMPHETAMINE 30 MG PO TABS
1.0000 | ORAL_TABLET | Freq: Two times a day (BID) | ORAL | 0 refills | Status: DC
Start: 2022-09-25 — End: 2022-10-23
  Filled 2022-09-25: qty 60, 30d supply, fill #0

## 2022-09-26 ENCOUNTER — Other Ambulatory Visit (HOSPITAL_COMMUNITY): Payer: Self-pay

## 2022-10-03 ENCOUNTER — Encounter: Payer: 59 | Admitting: Internal Medicine

## 2022-10-18 ENCOUNTER — Other Ambulatory Visit (HOSPITAL_COMMUNITY): Payer: Self-pay

## 2022-10-23 ENCOUNTER — Other Ambulatory Visit: Payer: Self-pay

## 2022-10-23 ENCOUNTER — Other Ambulatory Visit (HOSPITAL_COMMUNITY): Payer: Self-pay

## 2022-10-23 MED ORDER — AMPHETAMINE-DEXTROAMPHETAMINE 30 MG PO TABS
1.0000 | ORAL_TABLET | Freq: Two times a day (BID) | ORAL | 0 refills | Status: DC
Start: 2022-10-25 — End: 2023-11-27
  Filled 2022-10-26 – 2022-10-27 (×2): qty 60, 30d supply, fill #0

## 2022-10-26 ENCOUNTER — Other Ambulatory Visit: Payer: Self-pay

## 2022-10-26 ENCOUNTER — Other Ambulatory Visit (HOSPITAL_COMMUNITY): Payer: Self-pay

## 2022-10-26 MED ORDER — LORAZEPAM 1 MG PO TABS
1.0000 mg | ORAL_TABLET | Freq: Two times a day (BID) | ORAL | 1 refills | Status: DC
  Filled 2022-10-31: qty 60, 30d supply, fill #0
  Filled 2022-11-27: qty 60, 30d supply, fill #1

## 2022-10-27 ENCOUNTER — Other Ambulatory Visit (HOSPITAL_COMMUNITY): Payer: Self-pay

## 2022-10-30 ENCOUNTER — Other Ambulatory Visit (HOSPITAL_COMMUNITY): Payer: Self-pay

## 2022-10-31 ENCOUNTER — Other Ambulatory Visit: Payer: Self-pay

## 2022-11-08 ENCOUNTER — Other Ambulatory Visit (HOSPITAL_COMMUNITY): Payer: Self-pay

## 2022-11-18 DIAGNOSIS — F5101 Primary insomnia: Secondary | ICD-10-CM | POA: Diagnosis not present

## 2022-11-18 DIAGNOSIS — F900 Attention-deficit hyperactivity disorder, predominantly inattentive type: Secondary | ICD-10-CM | POA: Diagnosis not present

## 2022-11-18 DIAGNOSIS — F3342 Major depressive disorder, recurrent, in full remission: Secondary | ICD-10-CM | POA: Diagnosis not present

## 2022-11-20 ENCOUNTER — Other Ambulatory Visit (HOSPITAL_COMMUNITY): Payer: Self-pay

## 2022-11-20 ENCOUNTER — Other Ambulatory Visit: Payer: Self-pay

## 2022-11-24 ENCOUNTER — Other Ambulatory Visit (HOSPITAL_COMMUNITY): Payer: Self-pay

## 2022-11-24 ENCOUNTER — Other Ambulatory Visit: Payer: Self-pay

## 2022-11-24 MED ORDER — GABAPENTIN 400 MG PO CAPS
400.0000 mg | ORAL_CAPSULE | Freq: Four times a day (QID) | ORAL | 11 refills | Status: AC | PRN
  Filled 2023-01-23: qty 120, 30d supply, fill #0
  Filled 2023-04-16: qty 120, 30d supply, fill #1
  Filled 2023-09-24: qty 120, 30d supply, fill #2

## 2022-11-24 MED ORDER — AMPHETAMINE-DEXTROAMPHETAMINE 30 MG PO TABS
30.0000 mg | ORAL_TABLET | Freq: Two times a day (BID) | ORAL | 0 refills | Status: DC
  Filled 2023-02-09: qty 60, 30d supply, fill #0

## 2022-11-24 MED ORDER — AMPHETAMINE-DEXTROAMPHETAMINE 30 MG PO TABS
30.0000 mg | ORAL_TABLET | Freq: Two times a day (BID) | ORAL | 0 refills | Status: DC
  Filled 2023-01-08: qty 60, 30d supply, fill #0

## 2022-11-24 MED ORDER — DESVENLAFAXINE SUCCINATE ER 100 MG PO TB24
100.0000 mg | ORAL_TABLET | Freq: Every morning | ORAL | 11 refills | Status: DC
  Filled 2022-12-08: qty 30, 30d supply, fill #0
  Filled 2023-01-18: qty 30, 30d supply, fill #1
  Filled 2023-02-19: qty 30, 30d supply, fill #2
  Filled 2023-03-16: qty 30, 30d supply, fill #3
  Filled 2023-04-13: qty 30, 30d supply, fill #4
  Filled 2023-05-11: qty 30, 30d supply, fill #5
  Filled 2023-06-07: qty 30, 30d supply, fill #6
  Filled 2023-07-04: qty 30, 30d supply, fill #7
  Filled 2023-08-01: qty 30, 30d supply, fill #8
  Filled 2023-08-29: qty 30, 30d supply, fill #9
  Filled 2023-09-27: qty 30, 30d supply, fill #10
  Filled 2023-10-31: qty 30, 30d supply, fill #11

## 2022-11-24 MED ORDER — AMPHETAMINE-DEXTROAMPHETAMINE 30 MG PO TABS
30.0000 mg | ORAL_TABLET | Freq: Two times a day (BID) | ORAL | 0 refills | Status: DC
  Filled 2022-11-24: qty 60, 30d supply, fill #0

## 2022-11-24 MED ORDER — TRAZODONE HCL 50 MG PO TABS
50.0000 mg | ORAL_TABLET | Freq: Every evening | ORAL | 3 refills | Status: DC | PRN
  Filled 2022-11-24: qty 90, 30d supply, fill #0
  Filled 2023-01-23: qty 90, 30d supply, fill #1
  Filled 2023-04-10: qty 90, 30d supply, fill #2
  Filled 2023-05-21 (×2): qty 90, 30d supply, fill #3
  Filled 2023-06-20: qty 90, 30d supply, fill #4
  Filled 2023-07-25: qty 90, 30d supply, fill #5
  Filled 2023-09-24: qty 90, 30d supply, fill #6
  Filled 2023-10-24: qty 90, 30d supply, fill #7

## 2022-12-08 ENCOUNTER — Other Ambulatory Visit (HOSPITAL_COMMUNITY): Payer: Self-pay

## 2023-01-05 ENCOUNTER — Other Ambulatory Visit (HOSPITAL_COMMUNITY): Payer: Self-pay

## 2023-01-05 MED ORDER — LORAZEPAM 1 MG PO TABS
1.0000 mg | ORAL_TABLET | Freq: Two times a day (BID) | ORAL | 2 refills | Status: DC | PRN
  Filled 2023-01-05: qty 60, 30d supply, fill #0
  Filled 2023-02-13: qty 60, 30d supply, fill #1
  Filled 2023-03-13 – 2023-03-14 (×2): qty 60, 30d supply, fill #2

## 2023-01-08 ENCOUNTER — Other Ambulatory Visit (HOSPITAL_COMMUNITY): Payer: Self-pay

## 2023-01-16 ENCOUNTER — Other Ambulatory Visit (HOSPITAL_COMMUNITY): Payer: Self-pay

## 2023-01-18 ENCOUNTER — Encounter: Payer: 59 | Admitting: Internal Medicine

## 2023-01-21 ENCOUNTER — Other Ambulatory Visit: Payer: Self-pay | Admitting: Internal Medicine

## 2023-01-21 ENCOUNTER — Encounter: Payer: Self-pay | Admitting: *Deleted

## 2023-01-23 ENCOUNTER — Other Ambulatory Visit (HOSPITAL_COMMUNITY): Payer: Self-pay

## 2023-01-24 ENCOUNTER — Other Ambulatory Visit (HOSPITAL_COMMUNITY): Payer: Self-pay

## 2023-01-24 MED ORDER — AMLODIPINE BESYLATE 10 MG PO TABS
10.0000 mg | ORAL_TABLET | Freq: Every day | ORAL | 0 refills | Status: DC
  Filled 2023-01-24: qty 30, 30d supply, fill #0
  Filled 2023-02-21: qty 30, 30d supply, fill #1

## 2023-02-09 ENCOUNTER — Other Ambulatory Visit (HOSPITAL_COMMUNITY): Payer: Self-pay

## 2023-02-13 ENCOUNTER — Other Ambulatory Visit: Payer: Self-pay | Admitting: Internal Medicine

## 2023-02-14 ENCOUNTER — Other Ambulatory Visit: Payer: Self-pay

## 2023-02-14 ENCOUNTER — Other Ambulatory Visit (HOSPITAL_COMMUNITY): Payer: Self-pay

## 2023-02-14 MED ORDER — NORETHINDRONE 0.35 MG PO TABS
1.0000 | ORAL_TABLET | Freq: Every day | ORAL | 1 refills | Status: DC
Start: 2023-02-14 — End: 2023-07-27
  Filled 2023-02-14: qty 84, 84d supply, fill #0
  Filled 2023-05-06: qty 84, 84d supply, fill #1

## 2023-02-19 ENCOUNTER — Other Ambulatory Visit: Payer: Self-pay | Admitting: Internal Medicine

## 2023-02-19 ENCOUNTER — Other Ambulatory Visit (HOSPITAL_COMMUNITY): Payer: Self-pay

## 2023-02-19 MED ORDER — METOPROLOL SUCCINATE ER 25 MG PO TB24
12.5000 mg | ORAL_TABLET | Freq: Every day | ORAL | 1 refills | Status: DC
  Filled 2023-02-19: qty 15, 30d supply, fill #0
  Filled 2023-03-08 – 2023-03-16 (×2): qty 15, 30d supply, fill #1
  Filled 2023-04-16: qty 15, 30d supply, fill #2
  Filled 2023-05-10: qty 15, 30d supply, fill #3
  Filled 2023-06-18: qty 15, 30d supply, fill #4
  Filled 2023-07-16: qty 15, 30d supply, fill #5

## 2023-03-08 ENCOUNTER — Other Ambulatory Visit (HOSPITAL_COMMUNITY): Payer: Self-pay

## 2023-03-13 ENCOUNTER — Other Ambulatory Visit (HOSPITAL_COMMUNITY): Payer: Self-pay

## 2023-03-13 ENCOUNTER — Other Ambulatory Visit: Payer: Self-pay

## 2023-03-13 MED ORDER — AMPHETAMINE-DEXTROAMPHETAMINE 30 MG PO TABS
30.0000 mg | ORAL_TABLET | Freq: Two times a day (BID) | ORAL | 0 refills | Status: DC
  Filled 2023-03-13: qty 60, 30d supply, fill #0

## 2023-03-15 DIAGNOSIS — Z1231 Encounter for screening mammogram for malignant neoplasm of breast: Secondary | ICD-10-CM | POA: Diagnosis not present

## 2023-03-15 LAB — HM MAMMOGRAPHY

## 2023-03-21 ENCOUNTER — Other Ambulatory Visit: Payer: Self-pay

## 2023-03-21 ENCOUNTER — Telehealth (INDEPENDENT_AMBULATORY_CARE_PROVIDER_SITE_OTHER): Admitting: Nurse Practitioner

## 2023-03-21 ENCOUNTER — Ambulatory Visit: Payer: Self-pay | Admitting: Internal Medicine

## 2023-03-21 ENCOUNTER — Telehealth: Payer: Self-pay

## 2023-03-21 VITALS — Ht 59.0 in | Wt 170.0 lb

## 2023-03-21 DIAGNOSIS — R6889 Other general symptoms and signs: Secondary | ICD-10-CM

## 2023-03-21 DIAGNOSIS — K529 Noninfective gastroenteritis and colitis, unspecified: Secondary | ICD-10-CM

## 2023-03-21 LAB — POCT INFLUENZA A/B
Influenza A, POC: NEGATIVE
Influenza B, POC: NEGATIVE

## 2023-03-21 MED ORDER — DICYCLOMINE HCL 10 MG PO CAPS
10.0000 mg | ORAL_CAPSULE | Freq: Three times a day (TID) | ORAL | 0 refills | Status: AC
  Filled 2023-03-21: qty 60, 15d supply, fill #0

## 2023-03-21 NOTE — Telephone Encounter (Signed)
 FYI

## 2023-03-21 NOTE — Telephone Encounter (Signed)
 Chief Complaint: Diarrhea, chills, fever, body aches Symptoms: see above Frequency: since Sunday evening Pertinent Negatives: Patient denies vomiting Disposition: []ED /[]Urgent Care (no appt availability in office) / [x]Appointment(In office/virtual)/ [] [facility]/ []Home Care/ []Refused Recommended Disposition /[][place] [facility]/ [] Follow-up with PCP Additional Notes: Patient called in reporting symptoms of diarrhea (10 times in last 24 hours), nausea, chills, fever, body aches, weakness. 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 Patient is unable to keep any fluids or food down. Patient appt made for further evaluation today.   Reason for Disposition  [1] SEVERE diarrhea (e.g., 7 or more times / day more than normal) AND [2] present > 24 hours (1 day)  Answer Assessment - Initial Assessment Questions 1. DIARRHEA SEVERITY: "How bad is the diarrhea?" "How many more stools have you had in the past 24 hours than normal?"    - NO DIARRHEA (SCALE 0)   - MILD (SCALE 1-3): Few loose or mushy BMs; increase of 1-3 stools over normal daily number of stools; mild increase in ostomy output.   -  MODERATE (SCALE 4-7): Increase of 4-6 stools daily over normal; moderate increase in ostomy output.   -  SEVERE (SCALE 8-10; OR "WORST POSSIBLE"): Increase of 7 or more stools daily over normal; moderate increase in ostomy output; incontinence.     10   2. ONSET: "When did the diarrhea begin?"      Late Sunday night 3. BM CONSISTENCY: "How loose or watery is the diarrhea?"      Loose and watery  4. VOMITING: "Are you also vomiting?" If Yes, ask: "How many times in the past 24 hours?"      No 5. ABDOMEN PAIN: "Are you having any abdomen pain?" If Yes, ask: "What does it feel like?" (e.g., crampy, dull, intermittent, constant)      Yes cramping comes and goes 6. ABDOMEN PAIN SEVERITY: If present, ask:  "How bad is the pain?"  (e.g., Scale 1-10; mild, moderate, or severe)   - MILD (1-3): doesn't interfere with normal activities, abdomen soft and not tender to touch    - MODERATE (4-7): interferes with normal activities or awakens from sleep, abdomen tender to touch    - SEVERE (8-10): excruciating pain, doubled over, unable to do any normal activities       6 7. ORAL INTAKE: If vomiting, "Have you been able to drink liquids?" "How much liquids have you had in the past 24 hours?"     Not able to keep much in 8. HYDRATION: "Any signs of dehydration?" (e.g., dry mouth [not just dry lips], too weak to stand, dizziness, new weight loss) "When did you last urinate?"     Weakness/tired  9. EXPOSURE: "Have you traveled to a foreign country recently?" "Have you been exposed to anyone with diarrhea?" "Could you have eaten any food that was spoiled?"     Unsure of cause 10. ANTIBIOTIC USE: "Are you taking antibiotics now or have you taken antibiotics in the past 2 months?"       No 11. OTHER SYMPTOMS: "Do you have any other symptoms?" (e.g., fever, blood in stool)       Chills, fever, body aches  Protocols used: Diarrhea-A-AH

## 2023-03-21 NOTE — Telephone Encounter (Signed)
 Called pt in regards to video visit. Bethanie Dicker, NP would like pt to be swabbed for flu.    Informed pt that she can pull around the back of our building and call the number on the back door to let us know she is here and I will come out to swab her. She verbalized understanding and stated she will get dressed and come by

## 2023-03-21 NOTE — Progress Notes (Signed)
 MyChart Video Visit    Virtual Visit via Video Note   This visit type was conducted because this format is felt to be most appropriate for this patient at this time. Physical exam was limited by quality of the video and audio technology used for the visit. CMA was able to get the patient set up on a video visit.  Patient location: Home. Patient and provider in visit Provider location: Office  I discussed the limitations of evaluation and management by telemedicine and the availability of in person appointments. The patient expressed understanding and agreed to proceed.  Visit Date: 03/21/2023  Today's healthcare provider: Bethanie Dicker, NP     Subjective:    Patient ID: Angelica May, female    DOB: 09/22/1975, 48 y.o.   MRN: [id number]  Chief Complaint  Patient presents with   Diarrhea    Started: Sunday pm feeling fatigued, chills    HPI  Discussed the use of AI scribe software for clinical note transcription with the patient, who gave verbal consent to proceed.  History of Present Illness   Angelica May is a 48 year old female who presents with diarrhea and chills.   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 The diarrhea is watery and brownish, occurring six to seven times daily, with the most recent episode around lunchtime today. No blood in the stool is noted.  In addition to diarrhea, she feels weak, tired, and experiences hot and cold sensations. Although she has not measured her temperature, she suspects a fever. Fatigue has led to increased sleep, and she only managed to shower today due to weakness. No nausea or vomiting is present, but she does experience stomach cramps.  She has been taking Imodium, two pills today, to manage the diarrhea. Hydration is maintained with Gatorade and Powerade, and urination is normal. Her diet has been limited to crackers and protein drinks due to decreased  appetite.      Past Medical History:  Diagnosis Date   Anxiety    Depression    Hyperlipemia    Hypertension    Nephrolithiasis     Past Surgical History:  Procedure Laterality Date   EXTRACORPOREAL SHOCK WAVE LITHOTRIPSY Right 06/15/2016   Procedure: EXTRACORPOREAL SHOCK WAVE LITHOTRIPSY (ESWL);  Surgeon: Vanna Scotland, MD;  Location: [facility] ORS;  Service: Urology;  Laterality: Right;   LITHOTRIPSY  2007   LITHOTRIPSY  06/15/2016   WISDOM TOOTH EXTRACTION      Family History  Problem Relation Age of Onset   Breast cancer Maternal Grandmother    Breast cancer Cousin    Prostate cancer Paternal Uncle    Asthma Mother    Depression Mother    Diabetes Mother    Hypertension Mother    Depression Father    Hyperlipidemia Father    Hypertension Father    Stroke Father    Alcohol abuse Brother    Depression Brother    Hypertension Brother    Hyperlipidemia Brother    Hypertension Brother    Hyperlipidemia Brother    Hyperlipidemia Paternal Grandmother    Hypertension Paternal Grandmother    Kidney disease Paternal Grandmother    Stroke Paternal Grandmother    Kidney cancer Neg Hx     Social History   Socioeconomic History   Marital status: Single    Spouse name: Not on file   Number of children: Not on file   Years of education:  Not on file   Highest education level: Not on file  Occupational History   Not on file  Tobacco Use   Smoking status: Never   Smokeless tobacco: Never  Substance and Sexual Activity   Alcohol use: Yes    Comment: occ   Drug use: No   Sexual activity: Yes  Other Topics Concern   Not on file  Social History Narrative   Not on file   Social Drivers of Health   Financial Resource Strain: Not on file  Food Insecurity: Not on file  Transportation Needs: Not on file  Physical Activity: Not on file  Stress: Not on file  Social Connections: Not on file  Intimate Partner Violence: Not on file    Outpatient Medications Prior to  Visit  Medication Sig Dispense Refill   amLODipine (NORVASC) 10 MG tablet Take 1 tablet (10 mg total) by mouth daily. 60 tablet 0   amphetamine-dextroamphetamine (ADDERALL) 30 MG tablet TK 1 T PO EVERY MORNING AND EVERY DAY AT NOON  0   amphetamine-dextroamphetamine (ADDERALL) 30 MG tablet Take 1 tablet by mouth 2 (two) times daily. 60 tablet 0   amphetamine-dextroamphetamine (ADDERALL) 30 MG tablet Take 1 tablet by mouth 2 (two) times daily. 60 tablet 0   amphetamine-dextroamphetamine (ADDERALL) 30 MG tablet Take 1 tablet by mouth 2 (two) times daily. 60 tablet 0   amphetamine-dextroamphetamine (ADDERALL) 30 MG tablet Take 1 tablet by mouth 2 (two) times daily. 60 tablet 0   amphetamine-dextroamphetamine (ADDERALL) 30 MG tablet Take 1 tablet by mouth 2 (two) times daily. 60 tablet 0   amphetamine-dextroamphetamine (ADDERALL) 30 MG tablet Take 1 tablet by mouth 2 (two) times daily. 60 tablet 0   amphetamine-dextroamphetamine (ADDERALL) 30 MG tablet Take 1 tablet by mouth 2 (two) times daily. 60 tablet 0   amphetamine-dextroamphetamine (ADDERALL) 30 MG tablet Take 1 tablet by mouth 2 (two) times daily. 60 tablet 0   amphetamine-dextroamphetamine (ADDERALL) 30 MG tablet Take 1 tablet by mouth 2 (two) times daily. 60 tablet 0   amphetamine-dextroamphetamine (ADDERALL) 30 MG tablet Take 1 tablet by mouth 2 (two) times daily. 60 tablet 0   desvenlafaxine (PRISTIQ) 100 MG 24 hr tablet Take 100 mg by mouth daily.     desvenlafaxine (PRISTIQ) 100 MG 24 hr tablet Take 1 tablet (100 mg total) by mouth in the morning. 30 tablet 12   desvenlafaxine (PRISTIQ) 100 MG 24 hr tablet Take 1 tablet (100 mg total) by mouth in the morning. 30 tablet 11   gabapentin (NEURONTIN) 400 MG capsule Take 1 capsule (400 mg total) by mouth 4 (four) times daily as needed. 120 capsule 11   gabapentin (NEURONTIN) 400 MG capsule Take 1 capsule (400 mg total) by mouth 4 (four) times daily as needed. 120 capsule 11   LORazepam  (ATIVAN) 1 MG tablet      LORazepam (ATIVAN) 1 MG tablet Take 1 tablet (1 mg total) by mouth 2 (two) times daily as needed. 60 tablet 1   LORazepam (ATIVAN) 1 MG tablet Take 1 tablet (1 mg total) by mouth 2 (two) times daily as needed. 60 tablet 1   LORazepam (ATIVAN) 1 MG tablet Take 1 tablet (1 mg total) by mouth 2 (two) times daily as needed. 60 tablet 2   metoprolol succinate (TOPROL-XL) 25 MG 24 hr tablet Take 1/2 tablet (12.5 mg total) by mouth daily. 45 tablet 1   metroNIDAZOLE (FLAGYL) 500 MG tablet Take 1 tablet (500 mg total) by mouth 2 (  two) times daily. 14 tablet 0   mupirocin ointment (BACTROBAN) 2 % Apply 1 application topically 2 (two) times daily to affected area 22 g 0   norethindrone (INCASSIA) 0.35 MG tablet Take 1 tablet (0.35 mg total) by mouth daily. 84 tablet 1   traZODone (DESYREL) 50 MG tablet Take 50 mg by mouth at bedtime.     traZODone (DESYREL) 50 MG tablet Take 1-3 tablets (50-150 mg total) by mouth every night 270 tablet 0   traZODone (DESYREL) 50 MG tablet Take 1-3 tablets (50-150 mg total) by mouth at bedtime as needed. 270 tablet 3   traZODone (DESYREL) 50 MG tablet Take 1-3 tablets (50-150 mg total) by mouth at bedtime as needed. 270 tablet 4   traZODone (DESYREL) 50 MG tablet Take 1-3 tablets (50-150 mg total) by mouth at bedtime as needed. 270 tablet 3   No facility-administered medications prior to visit.    Allergies  Allergen Reactions   Penicillins Rash    Has patient had a PCN reaction causing immediate rash, facial/tongue/throat swelling, SOB or lightheadedness with hypotension: Yes  Has patient had a PCN reaction causing severe rash involving mucus membranes or skin necrosis: No  Has patient had a PCN reaction that required hospitalization: No  Has patient had a PCN reaction occurring within the last 10 years: No  If all of the above answers are "NO", then may proceed with Cephalosporin use.  Several years ago  Has patient had a PCN reaction  causing immediate rash, facial/tongue/throat swelling, SOB or lightheadedness with hypotension: Yes  Has patient had a PCN reaction causing severe rash involving mucus membranes or skin necrosis: No  Has patient had a PCN reaction that required hospitalization: No  Has patient had a PCN reaction occurring within the last 10 years: No  If all of the above answers are "NO", then may proceed with Cephalosporin use.  Has patient had a PCN reaction causing immediate rash, facial/tongue/throat swelling, SOB or lightheadedness with hypotension: Yes  Has patient had a PCN reaction causing severe rash involving mucus membranes or skin necrosis: No  Has patient had a PCN reaction that required hospitalization: No  Has patient had a PCN reaction occurring within the last 10 years: No  If all of the above answers are "NO", then may proceed with Cephalosporin use.    ROS See HPI    Objective:    Physical Exam  Ht 4\' 11"  (1.499 m)   Wt 170 lb (77.1 kg)   BMI 34.34 kg/m  Wt Readings from Last 3 Encounters:  03/21/23 170 lb (77.1 kg)  01/19/22 170 lb (77.1 kg)  04/26/21 155 lb 12.8 oz (70.7 kg)   GENERAL: alert, oriented, appears well and in no acute distress   HEENT: atraumatic, conjunttiva clear, no obvious abnormalities on inspection of external nose and ears   NECK: normal movements of the head and neck   LUNGS: on inspection no signs of respiratory distress, breathing rate appears normal, no obvious gross SOB, gasping or wheezing   CV: no obvious cyanosis   MS: moves all visible extremities without noticeable abnormality   PSYCH/NEURO: pleasant and cooperative, no obvious depression or anxiety, speech and thought processing grossly intact    Assessment & Plan:   Problem List Items Addressed This Visit       Digestive   Gastroenteritis - Primary   Symptoms and exposure history suggest likely viral gastroenteritis. Managed with hydration and Imodium, showing some improvement.  Bentyl prescribed for cramping  and diarrhea. Discussed dehydration signs and when to seek further care. Encourage hydration with fluids like Gatorade and Powerade. Advise a bland diet, avoiding spicy or heavy meals. Monitor for dehydration signs and advise a hospital visit if present. Instruct to return if symptoms persist; consider stool samples and lab tests.       Relevant Medications   dicyclomine (BENTYL) 10 MG capsule   Other Visit Diagnoses       Flu-like symptoms           I am having Farrel Gordon. Elbe start on dicyclomine. I am also having her maintain her desvenlafaxine, traZODone, amphetamine-dextroamphetamine, LORazepam, metroNIDAZOLE, amphetamine-dextroamphetamine, amphetamine-dextroamphetamine, amphetamine-dextroamphetamine, traZODone, desvenlafaxine, gabapentin, traZODone, amphetamine-dextroamphetamine, amphetamine-dextroamphetamine, mupirocin ointment, amphetamine-dextroamphetamine, traZODone, LORazepam, amphetamine-dextroamphetamine, LORazepam, amphetamine-dextroamphetamine, amphetamine-dextroamphetamine, gabapentin, desvenlafaxine, traZODone, LORazepam, amLODipine, norethindrone, metoprolol succinate, and amphetamine-dextroamphetamine.  Meds ordered this encounter  Medications   dicyclomine (BENTYL) 10 MG capsule    Sig: Take 1 capsule (10 mg total) by mouth 4 (four) times daily -  before meals and at bedtime.    Dispense:  60 capsule    Refill:  0    Supervising Provider:   Sherlene Shams [[id number]]   I discussed the assessment and treatment plan with the patient. The patient was provided an opportunity to ask questions and all were answered. The patient agreed with the plan and demonstrated an understanding of the instructions.   The patient was advised to call back or seek an in-person evaluation if the symptoms worsen or if the condition fails to improve as anticipated.   Bethanie Dicker, NP [facility] [phone]  (phone) [phone] (fax)  [facility]

## 2023-03-21 NOTE — Assessment & Plan Note (Signed)
 Symptoms and exposure history suggest likely viral gastroenteritis. Managed with hydration and Imodium, showing some improvement. Bentyl prescribed for cramping and diarrhea. Discussed dehydration signs and when to seek further care. Encourage hydration with fluids like Gatorade and Powerade. Advise a bland diet, avoiding spicy or heavy meals. Monitor for dehydration signs and advise a hospital visit if present. Instruct to return if symptoms persist; consider stool samples and lab tests.

## 2023-03-21 NOTE — Telephone Encounter (Signed)
 Noted. Pt is scheduled to see Bethanie Dicker NP today @ 4pm

## 2023-03-26 ENCOUNTER — Other Ambulatory Visit: Payer: Self-pay | Admitting: Internal Medicine

## 2023-03-26 ENCOUNTER — Other Ambulatory Visit (HOSPITAL_COMMUNITY): Payer: Self-pay

## 2023-03-26 MED ORDER — AMLODIPINE BESYLATE 10 MG PO TABS
10.0000 mg | ORAL_TABLET | Freq: Every day | ORAL | 0 refills | Status: DC
  Filled 2023-03-26: qty 30, 30d supply, fill #0
  Filled 2023-04-23: qty 30, 30d supply, fill #1

## 2023-03-29 ENCOUNTER — Encounter: Payer: 59 | Admitting: Internal Medicine

## 2023-04-16 ENCOUNTER — Other Ambulatory Visit (HOSPITAL_COMMUNITY): Payer: Self-pay

## 2023-04-16 ENCOUNTER — Other Ambulatory Visit: Payer: Self-pay

## 2023-04-16 MED ORDER — LORAZEPAM 1 MG PO TABS
1.0000 mg | ORAL_TABLET | Freq: Two times a day (BID) | ORAL | 0 refills | Status: DC | PRN
  Filled 2023-04-16: qty 60, 30d supply, fill #0
  Filled 2023-04-17: qty 2, 1d supply, fill #0
  Filled 2023-04-17: qty 60, 30d supply, fill #0

## 2023-04-17 ENCOUNTER — Other Ambulatory Visit (HOSPITAL_COMMUNITY): Payer: Self-pay

## 2023-04-17 ENCOUNTER — Other Ambulatory Visit: Payer: Self-pay

## 2023-04-19 ENCOUNTER — Other Ambulatory Visit (HOSPITAL_COMMUNITY): Payer: Self-pay

## 2023-04-19 MED ORDER — AMPHETAMINE-DEXTROAMPHETAMINE 30 MG PO TABS
1.0000 | ORAL_TABLET | Freq: Two times a day (BID) | ORAL | 0 refills | Status: DC
  Filled 2023-04-19: qty 60, 30d supply, fill #0

## 2023-04-20 ENCOUNTER — Other Ambulatory Visit (HOSPITAL_COMMUNITY): Payer: Self-pay

## 2023-04-24 ENCOUNTER — Other Ambulatory Visit (HOSPITAL_COMMUNITY): Payer: Self-pay

## 2023-04-26 ENCOUNTER — Other Ambulatory Visit (HOSPITAL_COMMUNITY): Payer: Self-pay

## 2023-04-26 ENCOUNTER — Encounter: Admitting: Internal Medicine

## 2023-05-09 ENCOUNTER — Other Ambulatory Visit (HOSPITAL_COMMUNITY): Payer: Self-pay

## 2023-05-16 ENCOUNTER — Other Ambulatory Visit (HOSPITAL_COMMUNITY): Payer: Self-pay

## 2023-05-16 MED ORDER — LORAZEPAM 1 MG PO TABS
1.0000 mg | ORAL_TABLET | Freq: Two times a day (BID) | ORAL | 0 refills | Status: DC | PRN
  Filled 2023-05-17: qty 60, 30d supply, fill #0

## 2023-05-17 ENCOUNTER — Other Ambulatory Visit (HOSPITAL_COMMUNITY): Payer: Self-pay

## 2023-05-18 ENCOUNTER — Other Ambulatory Visit (HOSPITAL_COMMUNITY): Payer: Self-pay

## 2023-05-18 DIAGNOSIS — F900 Attention-deficit hyperactivity disorder, predominantly inattentive type: Secondary | ICD-10-CM | POA: Diagnosis not present

## 2023-05-18 DIAGNOSIS — F5101 Primary insomnia: Secondary | ICD-10-CM | POA: Diagnosis not present

## 2023-05-18 DIAGNOSIS — F3342 Major depressive disorder, recurrent, in full remission: Secondary | ICD-10-CM | POA: Diagnosis not present

## 2023-05-18 MED ORDER — AMPHETAMINE-DEXTROAMPHETAMINE 30 MG PO TABS
30.0000 mg | ORAL_TABLET | Freq: Two times a day (BID) | ORAL | 0 refills | Status: DC
  Filled 2023-05-18: qty 60, 30d supply, fill #0

## 2023-05-21 ENCOUNTER — Other Ambulatory Visit: Payer: Self-pay

## 2023-05-21 ENCOUNTER — Other Ambulatory Visit (HOSPITAL_COMMUNITY): Payer: Self-pay

## 2023-05-30 ENCOUNTER — Other Ambulatory Visit: Payer: Self-pay | Admitting: Internal Medicine

## 2023-06-01 ENCOUNTER — Other Ambulatory Visit (HOSPITAL_COMMUNITY): Payer: Self-pay

## 2023-06-01 ENCOUNTER — Other Ambulatory Visit: Payer: Self-pay

## 2023-06-01 MED ORDER — AMLODIPINE BESYLATE 10 MG PO TABS
10.0000 mg | ORAL_TABLET | Freq: Every day | ORAL | 1 refills | Status: DC
  Filled 2023-06-01: qty 30, 30d supply, fill #0
  Filled 2023-06-26: qty 30, 30d supply, fill #1
  Filled 2023-07-27: qty 30, 30d supply, fill #2
  Filled 2023-08-27: qty 30, 30d supply, fill #3
  Filled 2023-09-24: qty 30, 30d supply, fill #4
  Filled 2023-10-24: qty 30, 30d supply, fill #5

## 2023-06-14 ENCOUNTER — Other Ambulatory Visit (HOSPITAL_COMMUNITY): Payer: Self-pay

## 2023-06-14 MED ORDER — LORAZEPAM 1 MG PO TABS
1.0000 mg | ORAL_TABLET | Freq: Two times a day (BID) | ORAL | 2 refills | Status: DC | PRN
  Filled 2023-06-18: qty 60, 30d supply, fill #0
  Filled 2023-07-16: qty 60, 30d supply, fill #1
  Filled 2023-08-14: qty 60, 30d supply, fill #2

## 2023-06-15 ENCOUNTER — Other Ambulatory Visit (HOSPITAL_COMMUNITY): Payer: Self-pay

## 2023-06-18 ENCOUNTER — Other Ambulatory Visit: Payer: Self-pay

## 2023-06-18 ENCOUNTER — Other Ambulatory Visit (HOSPITAL_COMMUNITY): Payer: Self-pay

## 2023-06-20 ENCOUNTER — Other Ambulatory Visit (HOSPITAL_COMMUNITY): Payer: Self-pay

## 2023-06-21 ENCOUNTER — Other Ambulatory Visit (HOSPITAL_COMMUNITY): Payer: Self-pay

## 2023-06-21 ENCOUNTER — Other Ambulatory Visit: Payer: Self-pay

## 2023-06-21 MED ORDER — AMPHETAMINE-DEXTROAMPHETAMINE 30 MG PO TABS
1.0000 | ORAL_TABLET | Freq: Two times a day (BID) | ORAL | 0 refills | Status: DC
  Filled 2023-06-21: qty 60, 30d supply, fill #0

## 2023-06-26 ENCOUNTER — Encounter: Admitting: Internal Medicine

## 2023-06-28 ENCOUNTER — Other Ambulatory Visit (HOSPITAL_COMMUNITY): Payer: Self-pay

## 2023-07-16 ENCOUNTER — Other Ambulatory Visit: Payer: Self-pay

## 2023-07-25 ENCOUNTER — Other Ambulatory Visit: Payer: Self-pay

## 2023-07-25 ENCOUNTER — Other Ambulatory Visit (HOSPITAL_COMMUNITY): Payer: Self-pay

## 2023-07-25 MED ORDER — AMPHETAMINE-DEXTROAMPHETAMINE 30 MG PO TABS
30.0000 mg | ORAL_TABLET | Freq: Two times a day (BID) | ORAL | 0 refills | Status: DC
  Filled 2023-07-25: qty 60, 30d supply, fill #0

## 2023-07-27 ENCOUNTER — Other Ambulatory Visit: Payer: Self-pay | Admitting: Internal Medicine

## 2023-07-27 ENCOUNTER — Other Ambulatory Visit: Payer: Self-pay

## 2023-07-27 ENCOUNTER — Other Ambulatory Visit (HOSPITAL_COMMUNITY): Payer: Self-pay

## 2023-07-27 MED ORDER — NORETHINDRONE 0.35 MG PO TABS
1.0000 | ORAL_TABLET | Freq: Every day | ORAL | 0 refills | Status: DC
  Filled 2023-07-27: qty 84, 84d supply, fill #0

## 2023-08-01 ENCOUNTER — Other Ambulatory Visit (HOSPITAL_COMMUNITY): Payer: Self-pay

## 2023-08-10 ENCOUNTER — Other Ambulatory Visit (HOSPITAL_COMMUNITY): Payer: Self-pay

## 2023-08-10 ENCOUNTER — Other Ambulatory Visit: Payer: Self-pay | Admitting: Internal Medicine

## 2023-08-10 MED ORDER — METOPROLOL SUCCINATE ER 25 MG PO TB24
12.5000 mg | ORAL_TABLET | Freq: Every day | ORAL | 1 refills | Status: DC
  Filled 2023-08-10: qty 15, 30d supply, fill #0
  Filled 2023-09-12 – 2023-10-24 (×2): qty 15, 30d supply, fill #1

## 2023-08-14 ENCOUNTER — Other Ambulatory Visit: Payer: Self-pay

## 2023-08-23 ENCOUNTER — Other Ambulatory Visit (HOSPITAL_COMMUNITY): Payer: Self-pay

## 2023-08-23 MED ORDER — AMPHETAMINE-DEXTROAMPHETAMINE 30 MG PO TABS
1.0000 | ORAL_TABLET | Freq: Two times a day (BID) | ORAL | 0 refills | Status: DC
  Filled 2023-08-24: qty 60, 30d supply, fill #0

## 2023-08-24 ENCOUNTER — Other Ambulatory Visit (HOSPITAL_COMMUNITY): Payer: Self-pay

## 2023-08-30 ENCOUNTER — Other Ambulatory Visit (HOSPITAL_COMMUNITY): Payer: Self-pay

## 2023-08-30 ENCOUNTER — Encounter (HOSPITAL_COMMUNITY): Payer: Self-pay

## 2023-09-06 ENCOUNTER — Encounter: Admitting: Internal Medicine

## 2023-09-12 ENCOUNTER — Other Ambulatory Visit (HOSPITAL_COMMUNITY): Payer: Self-pay

## 2023-09-12 ENCOUNTER — Other Ambulatory Visit: Payer: Self-pay

## 2023-09-12 MED ORDER — LORAZEPAM 1 MG PO TABS
1.0000 mg | ORAL_TABLET | Freq: Two times a day (BID) | ORAL | 1 refills | Status: DC | PRN
  Filled ????-??-??: fill #0

## 2023-09-13 ENCOUNTER — Other Ambulatory Visit (HOSPITAL_COMMUNITY): Payer: Self-pay

## 2023-09-24 ENCOUNTER — Other Ambulatory Visit (HOSPITAL_COMMUNITY): Payer: Self-pay

## 2023-09-24 ENCOUNTER — Other Ambulatory Visit: Payer: Self-pay

## 2023-09-24 MED ORDER — AMPHETAMINE-DEXTROAMPHETAMINE 30 MG PO TABS
1.0000 | ORAL_TABLET | Freq: Two times a day (BID) | ORAL | 0 refills | Status: DC
  Filled 2023-09-24: qty 60, 30d supply, fill #0

## 2023-10-17 ENCOUNTER — Other Ambulatory Visit: Payer: Self-pay | Admitting: Internal Medicine

## 2023-10-18 ENCOUNTER — Other Ambulatory Visit (HOSPITAL_COMMUNITY): Payer: Self-pay

## 2023-10-18 MED ORDER — NORETHINDRONE 0.35 MG PO TABS
1.0000 | ORAL_TABLET | Freq: Every day | ORAL | 0 refills | Status: DC
  Filled 2023-10-18: qty 84, 84d supply, fill #0

## 2023-10-24 ENCOUNTER — Other Ambulatory Visit: Payer: Self-pay

## 2023-10-24 ENCOUNTER — Other Ambulatory Visit (HOSPITAL_COMMUNITY): Payer: Self-pay

## 2023-10-25 ENCOUNTER — Other Ambulatory Visit (HOSPITAL_COMMUNITY): Payer: Self-pay

## 2023-10-25 MED ORDER — AMPHETAMINE-DEXTROAMPHETAMINE 30 MG PO TABS
1.0000 | ORAL_TABLET | Freq: Two times a day (BID) | ORAL | 0 refills | Status: DC
  Filled 2023-10-25: qty 60, 30d supply, fill #0

## 2023-10-26 ENCOUNTER — Encounter (HOSPITAL_COMMUNITY): Payer: Self-pay

## 2023-10-26 ENCOUNTER — Other Ambulatory Visit (HOSPITAL_COMMUNITY): Payer: Self-pay

## 2023-11-17 DIAGNOSIS — F3342 Major depressive disorder, recurrent, in full remission: Secondary | ICD-10-CM | POA: Diagnosis not present

## 2023-11-17 DIAGNOSIS — F900 Attention-deficit hyperactivity disorder, predominantly inattentive type: Secondary | ICD-10-CM | POA: Diagnosis not present

## 2023-11-17 DIAGNOSIS — F5101 Primary insomnia: Secondary | ICD-10-CM | POA: Diagnosis not present

## 2023-11-19 ENCOUNTER — Other Ambulatory Visit (HOSPITAL_COMMUNITY): Payer: Self-pay

## 2023-11-19 ENCOUNTER — Other Ambulatory Visit: Payer: Self-pay

## 2023-11-19 MED ORDER — TRAZODONE HCL 50 MG PO TABS
50.0000 mg | ORAL_TABLET | Freq: Every evening | ORAL | 3 refills | Status: AC | PRN
  Filled 2023-11-19: qty 90, 30d supply, fill #0

## 2023-11-19 MED ORDER — AMPHETAMINE-DEXTROAMPHETAMINE 30 MG PO TABS
30.0000 mg | ORAL_TABLET | Freq: Two times a day (BID) | ORAL | 0 refills | Status: AC
  Filled 2024-02-04: qty 60, 30d supply, fill #0

## 2023-11-19 MED ORDER — AMPHETAMINE-DEXTROAMPHETAMINE 30 MG PO TABS
30.0000 mg | ORAL_TABLET | Freq: Two times a day (BID) | ORAL | 0 refills | Status: AC
  Filled 2024-01-01: qty 60, 30d supply, fill #0

## 2023-11-19 MED ORDER — AMPHETAMINE-DEXTROAMPHETAMINE 30 MG PO TABS
30.0000 mg | ORAL_TABLET | Freq: Two times a day (BID) | ORAL | 0 refills | Status: AC
  Filled 2023-11-26: qty 60, 30d supply, fill #0

## 2023-11-19 MED ORDER — DESVENLAFAXINE SUCCINATE ER 100 MG PO TB24
100.0000 mg | ORAL_TABLET | Freq: Every morning | ORAL | 3 refills | Status: AC
  Filled 2023-11-26: qty 30, 30d supply, fill #0
  Filled 2023-12-28: qty 30, 30d supply, fill #1
  Filled 2024-01-21: qty 30, 30d supply, fill #2

## 2023-11-19 MED ORDER — LORAZEPAM 1 MG PO TABS
1.0000 mg | ORAL_TABLET | Freq: Two times a day (BID) | ORAL | 2 refills | Status: AC | PRN
  Filled 2023-11-19: qty 60, 30d supply, fill #0

## 2023-11-26 ENCOUNTER — Other Ambulatory Visit (HOSPITAL_COMMUNITY): Payer: Self-pay

## 2023-11-27 ENCOUNTER — Other Ambulatory Visit: Payer: Self-pay

## 2023-11-27 ENCOUNTER — Other Ambulatory Visit (HOSPITAL_COMMUNITY): Payer: Self-pay

## 2023-11-27 ENCOUNTER — Other Ambulatory Visit (HOSPITAL_COMMUNITY)
Admission: RE | Admit: 2023-11-27 | Discharge: 2023-11-27 | Disposition: A | Discharge status: Home / Self Care | Source: Ambulatory Visit | Attending: Internal Medicine | Admitting: Internal Medicine

## 2023-11-27 ENCOUNTER — Encounter: Payer: Self-pay | Admitting: Internal Medicine

## 2023-11-27 ENCOUNTER — Ambulatory Visit (INDEPENDENT_AMBULATORY_CARE_PROVIDER_SITE_OTHER): Admitting: Internal Medicine

## 2023-11-27 VITALS — BP 134/86 | HR 94 | Temp 99.0°F | Ht <= 58 in | Wt 162.5 lb

## 2023-11-27 DIAGNOSIS — I10 Essential (primary) hypertension: Secondary | ICD-10-CM

## 2023-11-27 DIAGNOSIS — N898 Other specified noninflammatory disorders of vagina: Secondary | ICD-10-CM

## 2023-11-27 DIAGNOSIS — Z13220 Encounter for screening for lipoid disorders: Secondary | ICD-10-CM

## 2023-11-27 DIAGNOSIS — Z1211 Encounter for screening for malignant neoplasm of colon: Secondary | ICD-10-CM

## 2023-11-27 DIAGNOSIS — Z0000 Encounter for general adult medical examination without abnormal findings: Secondary | ICD-10-CM | POA: Diagnosis not present

## 2023-11-27 DIAGNOSIS — Z1231 Encounter for screening mammogram for malignant neoplasm of breast: Secondary | ICD-10-CM

## 2023-11-27 LAB — COMPREHENSIVE METABOLIC PANEL WITH GFR
ALT: 26 U/L (ref 0–35)
AST: 23 U/L (ref 0–37)
Albumin: 4.7 g/dL (ref 3.5–5.2)
Alkaline Phosphatase: 77 U/L (ref 39–117)
BUN: 11 mg/dL (ref 6–23)
CO2: 30 meq/L (ref 19–32)
Calcium: 10 mg/dL (ref 8.4–10.5)
Chloride: 98 meq/L (ref 96–112)
Creatinine, Ser: 0.96 mg/dL (ref 0.40–1.20)
GFR: 69.5 mL/min (ref >60.00)
Glucose, Bld: 122 mg/dL — ABNORMAL HIGH (ref 70–99)
Potassium: 3.3 meq/L — ABNORMAL LOW (ref 3.5–5.1)
Sodium: 137 meq/L (ref 135–145)
Total Bilirubin: 1.3 mg/dL — ABNORMAL HIGH (ref 0.2–1.2)
Total Protein: 8 g/dL (ref 6.0–8.3)

## 2023-11-27 LAB — CBC WITH DIFFERENTIAL/PLATELET
Basophils Absolute: 0 K/uL (ref 0.0–0.1)
Basophils Relative: 0.4 % (ref 0.0–3.0)
Eosinophils Absolute: 0.2 K/uL (ref 0.0–0.7)
Eosinophils Relative: 2 % (ref 0.0–5.0)
HCT: 43.6 % (ref 36.0–46.0)
Hemoglobin: 14.7 g/dL (ref 12.0–15.0)
Lymphocytes Relative: 22.2 % (ref 12.0–46.0)
Lymphs Abs: 1.9 K/uL (ref 0.7–4.0)
MCHC: 33.8 g/dL (ref 30.0–36.0)
MCV: 85.7 fl (ref 78.0–100.0)
Monocytes Absolute: 0.6 K/uL (ref 0.1–1.0)
Monocytes Relative: 7 % (ref 3.0–12.0)
Neutro Abs: 5.9 K/uL (ref 1.4–7.7)
Neutrophils Relative %: 68.4 % (ref 43.0–77.0)
Platelets: 411 K/uL — ABNORMAL HIGH (ref 150.0–400.0)
RBC: 5.09 Mil/uL (ref 3.87–5.11)
RDW: 13.4 % (ref 11.5–15.5)
WBC: 8.6 K/uL (ref 4.0–10.5)

## 2023-11-27 LAB — LIPID PANEL
Cholesterol: 199 mg/dL (ref 0–200)
HDL: 68.5 mg/dL (ref >39.00)
LDL Cholesterol: 114 mg/dL — ABNORMAL HIGH (ref 0–99)
NonHDL: 130.38
Total CHOL/HDL Ratio: 3
Triglycerides: 83 mg/dL (ref 0.0–149.0)
VLDL: 16.6 mg/dL (ref 0.0–40.0)

## 2023-11-27 LAB — CERVICOVAGINAL ANCILLARY ONLY
Candida Glabrata: NEGATIVE
Candida Vaginitis: NEGATIVE
Comment: NEGATIVE
Comment: NEGATIVE

## 2023-11-27 LAB — TSH: TSH: 1.45 u[IU]/mL (ref 0.35–5.50)

## 2023-11-27 MED ORDER — NORETHINDRONE 0.35 MG PO TABS
1.0000 | ORAL_TABLET | Freq: Every day | ORAL | 3 refills | Status: AC
  Filled 2023-11-27 – 2024-01-09 (×2): qty 84, 84d supply, fill #0

## 2023-11-27 MED ORDER — AMLODIPINE BESYLATE 10 MG PO TABS
10.0000 mg | ORAL_TABLET | Freq: Every day | ORAL | 1 refills | Status: AC
  Filled 2023-11-27: qty 30, 30d supply, fill #0
  Filled 2023-12-24: qty 30, 30d supply, fill #1
  Filled 2024-01-24: qty 30, 30d supply, fill #2

## 2023-11-27 MED ORDER — METOPROLOL SUCCINATE ER 25 MG PO TB24
25.0000 mg | ORAL_TABLET | Freq: Every day | ORAL | 1 refills | Status: AC
Start: 2023-11-27 — End: ?
  Filled 2023-11-27: qty 30, 30d supply, fill #0
  Filled 2023-12-24: qty 30, 30d supply, fill #1
  Filled 2024-01-24: qty 30, 30d supply, fill #2

## 2023-11-27 NOTE — Progress Notes (Signed)
 Subjective:    Patient ID: Angelica May, female    DOB: September 28, 1975, 48 y.o.   MRN: [id number]  Patient here for  Chief Complaint  Patient presents with   Annual Exam    CPE    HPI Here for a physical exam. Last seen 01/2022. Has been on toprol  and amlodipine . Taking one toprol  per day now. Outside blood pressures averaging 120-80. Exercising. No chest pain or sob reported. No abdominal pain or bowel change reported. Does report - vaginal odor. Started over the last several days. No bleeding. Discussed OCPs. Elected to continue. Discussed due colonoscopy. Will notify me when agreeable.    Past Medical History:  Diagnosis Date   Anxiety    Depression    Hyperlipemia    Hypertension    Nephrolithiasis    Past Surgical History:  Procedure Laterality Date   EXTRACORPOREAL SHOCK WAVE LITHOTRIPSY Right 06/15/2016   Procedure: EXTRACORPOREAL SHOCK WAVE LITHOTRIPSY (ESWL);  Surgeon: Penne Knee, MD;  Location: [facility] ORS;  Service: Urology;  Laterality: Right;   LITHOTRIPSY  2007   LITHOTRIPSY  06/15/2016   WISDOM TOOTH EXTRACTION     Family History  Problem Relation Age of Onset   Breast cancer Maternal Grandmother    Breast cancer Cousin    Prostate cancer Paternal Uncle    Asthma Mother    Depression Mother    Diabetes Mother    Hypertension Mother    Depression Father    Hyperlipidemia Father    Hypertension Father    Stroke Father    Alcohol abuse Brother    Depression Brother    Hypertension Brother    Hyperlipidemia Brother    Hypertension Brother    Hyperlipidemia Brother    Hyperlipidemia Paternal Grandmother    Hypertension Paternal Grandmother    Kidney disease Paternal Grandmother    Stroke Paternal Grandmother    Kidney cancer Neg Hx    Social History   Socioeconomic History   Marital status: Single    Spouse name: Not on file   Number of children: Not on file   Years of education: Not on file   Highest education level: Associate degree:  occupational, scientist, product/process development, or vocational program  Occupational History   Not on file  Tobacco Use   Smoking status: Never   Smokeless tobacco: Never  Substance and Sexual Activity   Alcohol use: Yes    Comment: occ   Drug use: No   Sexual activity: Yes  Other Topics Concern   Not on file  Social History Narrative   Not on file   Social Drivers of Health   Financial Resource Strain: Low Risk  (11/26/2023)   Overall Financial Resource Strain (CARDIA)    Difficulty of Paying Living Expenses: Not hard at all  Food Insecurity: No Food Insecurity (11/26/2023)   Hunger Vital Sign    Worried About Running Out of Food in the Last Year: Never true    Ran Out of Food in the Last Year: Never true  Transportation Needs: No Transportation Needs (11/26/2023)   PRAPARE - Administrator, Civil Service (Medical): No    Lack of Transportation (Non-Medical): No  Physical Activity: Sufficiently Active (11/26/2023)   Exercise Vital Sign    Days of Exercise per Week: 7 days    Minutes of Exercise per Session: 90 min  Stress: Patient Declined (11/26/2023)   [employer] of Occupational Health - Occupational Stress Questionnaire    Feeling of Stress: Patient declined  Social Connections: Moderately Integrated (11/26/2023)   Social Connection and Isolation Panel    Frequency of Communication with Friends and Family: Three times a week    Frequency of Social Gatherings with Friends and Family: Once a week    Attends Religious Services: More than 4 times per year    Active Member of [employer] or Organizations: Yes    Attends Engineer, Structural: More than 4 times per year    Marital Status: Never married     Review of Systems  Constitutional:  Negative for appetite change and unexpected weight change.  HENT:  Negative for congestion, sinus pressure and sore throat.   Eyes:  Negative for pain and visual disturbance.  Respiratory:  Negative for cough, chest tightness and  shortness of breath.   Cardiovascular:  Negative for chest pain, palpitations and leg swelling.  Gastrointestinal:  Negative for abdominal pain, diarrhea, nausea and vomiting.  Genitourinary:  Negative for difficulty urinating and dysuria.       Vaginal odor.   Musculoskeletal:  Negative for joint swelling and myalgias.  Skin:  Negative for color change and rash.  Neurological:  Negative for dizziness and headaches.  Hematological:  Negative for adenopathy. Does not bruise/bleed easily.  Psychiatric/Behavioral:  Negative for agitation and dysphoric mood.        Objective:     BP 134/86   Pulse 94   Temp 99 F (37.2 C) (Oral)   Ht 4' 9.5 (1.461 m)   Wt 162 lb 8 oz (73.7 kg)   SpO2 99%   BMI 34.56 kg/m  Wt Readings from Last 3 Encounters:  11/27/23 162 lb 8 oz (73.7 kg)  03/21/23 170 lb (77.1 kg)  01/19/22 170 lb (77.1 kg)    Physical Exam Vitals reviewed.  Constitutional:      General: She is not in acute distress.    Appearance: Normal appearance. She is well-developed.  HENT:     Head: Normocephalic and atraumatic.     Right Ear: External ear normal.     Left Ear: External ear normal.     Mouth/Throat:     Pharynx: No oropharyngeal exudate or posterior oropharyngeal erythema.  Eyes:     General: No scleral icterus.       Right eye: No discharge.        Left eye: No discharge.     Conjunctiva/sclera: Conjunctivae normal.  Neck:     Thyroid: No thyromegaly.  Cardiovascular:     Rate and Rhythm: Normal rate and regular rhythm.  Pulmonary:     Effort: No tachypnea, accessory muscle usage or respiratory distress.     Breath sounds: Normal breath sounds. No decreased breath sounds or wheezing.  Chest:  Breasts:    Right: No inverted nipple, mass, nipple discharge or tenderness (no axillary adenopathy).     Left: No inverted nipple, mass, nipple discharge or tenderness (no axilarry adenopathy).  Abdominal:     General: Bowel sounds are normal.     Palpations:  Abdomen is soft.     Tenderness: There is no abdominal tenderness.  Genitourinary:    Comments: Normal external genitalia.  Vaginal vault without lesions.  Wet prep/KOH obtained. Could not appreciate any adnexal masses or tenderness.   Musculoskeletal:        General: No swelling or tenderness.     Cervical back: Neck supple.  Lymphadenopathy:     Cervical: No cervical adenopathy.  Skin:    Findings: No erythema or rash.  Neurological:     Mental Status: She is alert and oriented to person, place, and time.  Psychiatric:        Mood and Affect: Mood normal.        Behavior: Behavior normal.         Outpatient Encounter Medications as of 11/27/2023  Medication Sig   [START ON 01/23/2024] amphetamine -dextroamphetamine  (ADDERALL) 30 MG tablet Take 1 tablet by mouth 2 (two) times daily.   amphetamine -dextroamphetamine  (ADDERALL) 30 MG tablet Take 1 tablet by mouth 2 (two) times daily.   [START ON 12/24/2023] amphetamine -dextroamphetamine  (ADDERALL) 30 MG tablet Take 1 tablet by mouth 2 (two) times daily.   desvenlafaxine  (PRISTIQ ) 100 MG 24 hr tablet Take 1 tablet (100 mg total) by mouth in the morning.   dicyclomine  (BENTYL ) 10 MG capsule Take 1 capsule (10 mg total) by mouth 4 (four) times daily -  before meals and at bedtime.   gabapentin  (NEURONTIN ) 400 MG capsule Take 1 capsule (400 mg total) by mouth 4 (four) times daily as needed.   LORazepam  (ATIVAN ) 1 MG tablet Take 1 tablet (1 mg total) by mouth 2 (two) times daily as needed.   metroNIDAZOLE  (FLAGYL ) 500 MG tablet Take 1 tablet (500 mg total) by mouth 2 (two) times daily.   mupirocin  ointment (BACTROBAN ) 2 % Apply 1 application topically 2 (two) times daily to affected area   traZODone  (DESYREL ) 50 MG tablet Take 1-3 tablets (50-150 mg total) by mouth at bedtime as needed.   amLODipine  (NORVASC ) 10 MG tablet Take 1 tablet (10 mg total) by mouth daily.   metoprolol  succinate (TOPROL -XL) 25 MG 24 hr tablet Take 1 tablet (25 mg  total) by mouth daily.   norethindrone  (INCASSIA ) 0.35 MG tablet Take 1 tablet (0.35 mg total) by mouth daily.   [DISCONTINUED] amLODipine  (NORVASC ) 10 MG tablet Take 1 tablet (10 mg total) by mouth daily.   [DISCONTINUED] amphetamine -dextroamphetamine  (ADDERALL) 30 MG tablet TK 1 T PO EVERY MORNING AND EVERY DAY AT NOON   [DISCONTINUED] amphetamine -dextroamphetamine  (ADDERALL) 30 MG tablet Take 1 tablet by mouth 2 (two) times daily.   [DISCONTINUED] amphetamine -dextroamphetamine  (ADDERALL) 30 MG tablet Take 1 tablet by mouth 2 (two) times daily.   [DISCONTINUED] amphetamine -dextroamphetamine  (ADDERALL) 30 MG tablet Take 1 tablet by mouth 2 (two) times daily.   [DISCONTINUED] amphetamine -dextroamphetamine  (ADDERALL) 30 MG tablet Take 1 tablet by mouth 2 (two) times daily.   [DISCONTINUED] amphetamine -dextroamphetamine  (ADDERALL) 30 MG tablet Take 1 tablet by mouth 2 (two) times daily.   [DISCONTINUED] amphetamine -dextroamphetamine  (ADDERALL) 30 MG tablet Take 1 tablet by mouth 2 (two) times daily.   [DISCONTINUED] amphetamine -dextroamphetamine  (ADDERALL) 30 MG tablet Take 1 tablet by mouth 2 (two) times daily.   [DISCONTINUED] amphetamine -dextroamphetamine  (ADDERALL) 30 MG tablet Take 1 tablet by mouth 2 (two) times daily.   [DISCONTINUED] amphetamine -dextroamphetamine  (ADDERALL) 30 MG tablet Take 1 tablet by mouth 2 (two) times daily.   [DISCONTINUED] amphetamine -dextroamphetamine  (ADDERALL) 30 MG tablet Take 1 tablet by mouth 2 (two) times daily.   [DISCONTINUED] amphetamine -dextroamphetamine  (ADDERALL) 30 MG tablet Take 1 tablet by mouth 2 (two) times daily.   [DISCONTINUED] amphetamine -dextroamphetamine  (ADDERALL) 30 MG tablet Take 1 tablet by mouth 2 (two) times daily.   [DISCONTINUED] amphetamine -dextroamphetamine  (ADDERALL) 30 MG tablet Take 1 tablet by mouth 2 (two) times daily.   [DISCONTINUED] amphetamine -dextroamphetamine  (ADDERALL) 30 MG tablet Take 1 tablet by mouth 2 (two) times daily.    [DISCONTINUED] desvenlafaxine  (PRISTIQ ) 100 MG 24 hr tablet Take 100 mg by mouth daily.   [  DISCONTINUED] desvenlafaxine  (PRISTIQ ) 100 MG 24 hr tablet Take 1 tablet (100 mg total) by mouth in the morning.   [DISCONTINUED] desvenlafaxine  (PRISTIQ ) 100 MG 24 hr tablet Take 1 tablet (100 mg total) by mouth in the morning.   [DISCONTINUED] gabapentin  (NEURONTIN ) 400 MG capsule Take 1 capsule (400 mg total) by mouth 4 (four) times daily as needed.   [DISCONTINUED] LORazepam  (ATIVAN ) 1 MG tablet    [DISCONTINUED] LORazepam  (ATIVAN ) 1 MG tablet Take 1 tablet (1 mg total) by mouth 2 (two) times daily as needed.   [DISCONTINUED] LORazepam  (ATIVAN ) 1 MG tablet Take 1 tablet (1 mg total) by mouth 2 (two) times daily as needed.   [DISCONTINUED] metoprolol  succinate (TOPROL -XL) 25 MG 24 hr tablet Take 1/2 tablet (12.5 mg total) by mouth daily.   [DISCONTINUED] norethindrone  (INCASSIA ) 0.35 MG tablet Take 1 tablet (0.35 mg total) by mouth daily.   [DISCONTINUED] traZODone  (DESYREL ) 50 MG tablet Take 50 mg by mouth at bedtime.   [DISCONTINUED] traZODone  (DESYREL ) 50 MG tablet Take 1-3 tablets (50-150 mg total) by mouth every night   [DISCONTINUED] traZODone  (DESYREL ) 50 MG tablet Take 1-3 tablets (50-150 mg total) by mouth at bedtime as needed.   [DISCONTINUED] traZODone  (DESYREL ) 50 MG tablet Take 1-3 tablets (50-150 mg total) by mouth at bedtime as needed.   [DISCONTINUED] traZODone  (DESYREL ) 50 MG tablet Take 1-3 tablets (50-150 mg total) by mouth at bedtime as needed.   No facility-administered encounter medications on file as of 11/27/2023.     Lab Results  Component Value Date   WBC 7.8 04/26/2021   HGB 13.3 04/26/2021   HCT 39.7 04/26/2021   PLT 346.0 04/26/2021   GLUCOSE 86 04/26/2021   CHOL 198 04/26/2021   TRIG 87.0 04/26/2021   HDL 66.70 04/26/2021   LDLCALC 114 (H) 04/26/2021   ALT 12 04/26/2021   AST 13 04/26/2021   NA 137 04/26/2021   K 4.1 04/26/2021   CL 101 04/26/2021    CREATININE 0.92 04/26/2021   BUN 9 04/26/2021   CO2 28 04/26/2021   TSH 2.94 04/26/2021       Assessment & Plan:  Routine general medical examination at a health care facility  Hypertension, essential Assessment & Plan: Blood pressure as outlined.  Doing well.  Continue toprol  and amlodipine .  On one whole tablet per day now. Follow pressures.  Follow metabolic panel.   Orders: -     TSH -     CBC with Differential/Platelet -     Comprehensive metabolic panel with GFR  Screening cholesterol level -     Lipid panel  Healthcare maintenance Assessment & Plan: Physical today 11/27/23. PAP 04/26/21 - negative with negative HPV. Mammogram 03/15/23 - [facility]. Have discussed referral for colonoscopy. Discussed today. Will notify me when agreeable.    Encounter for screening mammogram for malignant neoplasm of breast -     3D Screening Mammogram, Left and Right; Future  Screening for colon cancer  Vaginal odor Assessment & Plan: Noticed over the last several days. KOH/wet prep obtained.   Orders: -     Cervicovaginal ancillary only  Other orders -     amLODIPine  Besylate; Take 1 tablet (10 mg total) by mouth daily.  Dispense: 90 tablet; Refill: 1 -     Metoprolol  Succinate ER; Take 1 tablet (25 mg total) by mouth daily.  Dispense: 90 tablet; Refill: 1 -     Norethindrone ; Take 1 tablet (0.35 mg total) by mouth daily.  Dispense:  84 tablet; Refill: 3     Allena Hamilton, MD

## 2023-11-27 NOTE — Assessment & Plan Note (Signed)
 Blood pressure as outlined.  Doing well.  Continue toprol  and amlodipine .  On one whole tablet per day now. Follow pressures.  Follow metabolic panel.

## 2023-11-27 NOTE — Assessment & Plan Note (Signed)
 Noticed over the last several days. KOH/wet prep obtained.

## 2023-11-27 NOTE — Assessment & Plan Note (Addendum)
 Physical today 11/27/23. PAP 04/26/21 - negative with negative HPV. Mammogram 03/15/23 - [facility] I. Have discussed referral for colonoscopy. Discussed today. Will notify me when agreeable.

## 2023-11-28 ENCOUNTER — Other Ambulatory Visit: Payer: Self-pay | Admitting: Internal Medicine

## 2023-11-28 ENCOUNTER — Other Ambulatory Visit (HOSPITAL_COMMUNITY): Payer: Self-pay

## 2023-11-28 ENCOUNTER — Ambulatory Visit: Payer: Self-pay | Admitting: Internal Medicine

## 2023-11-28 DIAGNOSIS — D75839 Thrombocytosis, unspecified: Secondary | ICD-10-CM

## 2023-11-28 DIAGNOSIS — E876 Hypokalemia: Secondary | ICD-10-CM

## 2023-11-28 NOTE — Progress Notes (Signed)
Orders placed for f/u labs.  

## 2023-11-28 NOTE — Telephone Encounter (Signed)
 Pt notified of lab results & would like to go to [facility] for her labs in 2 weeks.  I will reorder them

## 2023-12-14 LAB — HEPATIC FUNCTION PANEL
ALT: 19 IU/L (ref 0–32)
AST: 19 IU/L (ref 0–40)
Albumin: 4.5 g/dL (ref 3.9–4.9)
Alkaline Phosphatase: 98 IU/L (ref 41–116)
Bilirubin Total: 0.9 mg/dL (ref 0.0–1.2)
Bilirubin, Direct: 0.27 mg/dL (ref 0.00–0.40)
Total Protein: 7.2 g/dL (ref 6.0–8.5)

## 2023-12-14 LAB — CBC WITH DIFFERENTIAL/PLATELET
Basophils Absolute: 0 x10E3/uL (ref 0.0–0.2)
Basos: 0 %
EOS (ABSOLUTE): 0.2 x10E3/uL (ref 0.0–0.4)
Eos: 2 %
Hematocrit: 41.6 % (ref 34.0–46.6)
Hemoglobin: 13.6 g/dL (ref 11.1–15.9)
Immature Grans (Abs): 0 x10E3/uL (ref 0.0–0.1)
Immature Granulocytes: 0 %
Lymphocytes Absolute: 2.9 x10E3/uL (ref 0.7–3.1)
Lymphs: 31 %
MCH: 29.2 pg (ref 26.6–33.0)
MCHC: 32.7 g/dL (ref 31.5–35.7)
MCV: 90 fL (ref 79–97)
Monocytes Absolute: 0.7 x10E3/uL (ref 0.1–0.9)
Monocytes: 7 %
Neutrophils Absolute: 5.4 x10E3/uL (ref 1.4–7.0)
Neutrophils: 60 %
Platelets: 437 x10E3/uL (ref 150–450)
RBC: 4.65 x10E6/uL (ref 3.77–5.28)
RDW: 12.9 % (ref 11.7–15.4)
WBC: 9.2 x10E3/uL (ref 3.4–10.8)

## 2023-12-14 LAB — POTASSIUM: Potassium: 4 mmol/L (ref 3.5–5.2)

## 2023-12-24 ENCOUNTER — Other Ambulatory Visit (HOSPITAL_COMMUNITY): Payer: Self-pay

## 2023-12-24 ENCOUNTER — Other Ambulatory Visit: Payer: Self-pay

## 2023-12-31 ENCOUNTER — Other Ambulatory Visit (HOSPITAL_COMMUNITY): Payer: Self-pay

## 2024-01-01 ENCOUNTER — Other Ambulatory Visit (HOSPITAL_COMMUNITY): Payer: Self-pay

## 2024-01-09 ENCOUNTER — Other Ambulatory Visit (HOSPITAL_COMMUNITY): Payer: Self-pay

## 2024-01-09 ENCOUNTER — Encounter (HOSPITAL_COMMUNITY): Payer: Self-pay

## 2024-01-11 ENCOUNTER — Other Ambulatory Visit (HOSPITAL_COMMUNITY): Payer: Self-pay

## 2024-01-23 ENCOUNTER — Other Ambulatory Visit (HOSPITAL_COMMUNITY): Payer: Self-pay

## 2024-01-24 ENCOUNTER — Other Ambulatory Visit (HOSPITAL_COMMUNITY): Payer: Self-pay

## 2024-02-04 ENCOUNTER — Other Ambulatory Visit (HOSPITAL_COMMUNITY): Payer: Self-pay

## 2024-05-27 ENCOUNTER — Ambulatory Visit: Admitting: Internal Medicine
